# Patient Record
Sex: Male | Born: 1960 | Race: White | Hispanic: No | Marital: Married | Smoking: Never smoker
Health system: Southern US, Community
[De-identification: ages and names within clinical notes are randomized; demographics above are authoritative.]

## PROBLEM LIST (undated history)

## (undated) DIAGNOSIS — I10 Essential (primary) hypertension: Secondary | ICD-10-CM

## (undated) HISTORY — PX: APPENDECTOMY: SHX54

## (undated) HISTORY — DX: Essential (primary) hypertension: I10

---
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
## 2001-03-20 ENCOUNTER — Encounter: Admission: RE | Admit: 2001-03-20 | Discharge: 2001-03-20 | Payer: Self-pay | Admitting: Family Medicine

## 2001-03-20 ENCOUNTER — Encounter: Payer: Self-pay | Admitting: Family Medicine

## 2015-09-06 DIAGNOSIS — Z79899 Other long term (current) drug therapy: Secondary | ICD-10-CM | POA: Diagnosis not present

## 2015-09-06 DIAGNOSIS — L255 Unspecified contact dermatitis due to plants, except food: Secondary | ICD-10-CM | POA: Diagnosis not present

## 2015-09-06 DIAGNOSIS — I10 Essential (primary) hypertension: Secondary | ICD-10-CM | POA: Diagnosis not present

## 2015-09-06 DIAGNOSIS — L237 Allergic contact dermatitis due to plants, except food: Secondary | ICD-10-CM | POA: Diagnosis not present

## 2015-12-01 DIAGNOSIS — Z125 Encounter for screening for malignant neoplasm of prostate: Secondary | ICD-10-CM | POA: Diagnosis not present

## 2015-12-01 DIAGNOSIS — Z13220 Encounter for screening for lipoid disorders: Secondary | ICD-10-CM | POA: Diagnosis not present

## 2015-12-01 DIAGNOSIS — I10 Essential (primary) hypertension: Secondary | ICD-10-CM | POA: Diagnosis not present

## 2015-12-01 DIAGNOSIS — Z0000 Encounter for general adult medical examination without abnormal findings: Secondary | ICD-10-CM | POA: Diagnosis not present

## 2016-05-16 DIAGNOSIS — M25569 Pain in unspecified knee: Secondary | ICD-10-CM | POA: Diagnosis not present

## 2016-12-05 DIAGNOSIS — K219 Gastro-esophageal reflux disease without esophagitis: Secondary | ICD-10-CM | POA: Diagnosis not present

## 2016-12-05 DIAGNOSIS — Z125 Encounter for screening for malignant neoplasm of prostate: Secondary | ICD-10-CM | POA: Diagnosis not present

## 2016-12-05 DIAGNOSIS — L219 Seborrheic dermatitis, unspecified: Secondary | ICD-10-CM | POA: Diagnosis not present

## 2016-12-05 DIAGNOSIS — Z0000 Encounter for general adult medical examination without abnormal findings: Secondary | ICD-10-CM | POA: Diagnosis not present

## 2016-12-05 DIAGNOSIS — I10 Essential (primary) hypertension: Secondary | ICD-10-CM | POA: Diagnosis not present

## 2016-12-05 DIAGNOSIS — Z136 Encounter for screening for cardiovascular disorders: Secondary | ICD-10-CM | POA: Diagnosis not present

## 2016-12-24 DIAGNOSIS — K080 Exfoliation of teeth due to systemic causes: Secondary | ICD-10-CM | POA: Diagnosis not present

## 2017-02-08 DIAGNOSIS — M722 Plantar fascial fibromatosis: Secondary | ICD-10-CM | POA: Diagnosis not present

## 2017-02-08 DIAGNOSIS — M71571 Other bursitis, not elsewhere classified, right ankle and foot: Secondary | ICD-10-CM | POA: Diagnosis not present

## 2017-02-08 DIAGNOSIS — M7731 Calcaneal spur, right foot: Secondary | ICD-10-CM | POA: Diagnosis not present

## 2017-02-08 DIAGNOSIS — M79671 Pain in right foot: Secondary | ICD-10-CM | POA: Diagnosis not present

## 2017-06-11 DIAGNOSIS — F332 Major depressive disorder, recurrent severe without psychotic features: Secondary | ICD-10-CM | POA: Diagnosis not present

## 2017-07-04 DIAGNOSIS — Z23 Encounter for immunization: Secondary | ICD-10-CM | POA: Diagnosis not present

## 2017-07-11 DIAGNOSIS — R21 Rash and other nonspecific skin eruption: Secondary | ICD-10-CM | POA: Diagnosis not present

## 2017-07-11 DIAGNOSIS — F321 Major depressive disorder, single episode, moderate: Secondary | ICD-10-CM | POA: Diagnosis not present

## 2017-09-04 DIAGNOSIS — F321 Major depressive disorder, single episode, moderate: Secondary | ICD-10-CM | POA: Diagnosis not present

## 2017-09-04 DIAGNOSIS — I10 Essential (primary) hypertension: Secondary | ICD-10-CM | POA: Diagnosis not present

## 2017-10-16 DIAGNOSIS — K080 Exfoliation of teeth due to systemic causes: Secondary | ICD-10-CM | POA: Diagnosis not present

## 2017-11-18 DIAGNOSIS — K080 Exfoliation of teeth due to systemic causes: Secondary | ICD-10-CM | POA: Diagnosis not present

## 2018-05-30 DIAGNOSIS — F321 Major depressive disorder, single episode, moderate: Secondary | ICD-10-CM | POA: Diagnosis not present

## 2018-10-23 DIAGNOSIS — R1012 Left upper quadrant pain: Secondary | ICD-10-CM | POA: Diagnosis not present

## 2018-12-25 DIAGNOSIS — R05 Cough: Secondary | ICD-10-CM | POA: Diagnosis not present

## 2019-03-24 DIAGNOSIS — R0602 Shortness of breath: Secondary | ICD-10-CM | POA: Diagnosis not present

## 2019-03-24 DIAGNOSIS — Z8616 Personal history of COVID-19: Secondary | ICD-10-CM | POA: Diagnosis not present

## 2019-10-12 DIAGNOSIS — F329 Major depressive disorder, single episode, unspecified: Secondary | ICD-10-CM | POA: Diagnosis not present

## 2019-10-12 DIAGNOSIS — K219 Gastro-esophageal reflux disease without esophagitis: Secondary | ICD-10-CM | POA: Diagnosis not present

## 2019-10-12 DIAGNOSIS — R21 Rash and other nonspecific skin eruption: Secondary | ICD-10-CM | POA: Diagnosis not present

## 2019-11-05 DIAGNOSIS — Z1159 Encounter for screening for other viral diseases: Secondary | ICD-10-CM | POA: Diagnosis not present

## 2019-12-25 DIAGNOSIS — R21 Rash and other nonspecific skin eruption: Secondary | ICD-10-CM | POA: Diagnosis not present

## 2019-12-25 DIAGNOSIS — U099 Post covid-19 condition, unspecified: Secondary | ICD-10-CM | POA: Diagnosis not present

## 2019-12-25 DIAGNOSIS — R439 Unspecified disturbances of smell and taste: Secondary | ICD-10-CM | POA: Diagnosis not present

## 2019-12-25 DIAGNOSIS — G250 Essential tremor: Secondary | ICD-10-CM | POA: Diagnosis not present

## 2020-01-11 ENCOUNTER — Ambulatory Visit (INDEPENDENT_AMBULATORY_CARE_PROVIDER_SITE_OTHER): Payer: Federal, State, Local not specified - PPO | Admitting: Nurse Practitioner

## 2020-01-11 VITALS — BP 142/88 | HR 66 | Temp 97.7°F | Ht 71.0 in | Wt 182.0 lb

## 2020-01-11 DIAGNOSIS — R4189 Other symptoms and signs involving cognitive functions and awareness: Secondary | ICD-10-CM | POA: Insufficient documentation

## 2020-01-11 DIAGNOSIS — R0683 Snoring: Secondary | ICD-10-CM

## 2020-01-11 DIAGNOSIS — R9431 Abnormal electrocardiogram [ECG] [EKG]: Secondary | ICD-10-CM

## 2020-01-11 DIAGNOSIS — R0602 Shortness of breath: Secondary | ICD-10-CM

## 2020-01-11 DIAGNOSIS — U099 Post covid-19 condition, unspecified: Secondary | ICD-10-CM

## 2020-01-11 DIAGNOSIS — R21 Rash and other nonspecific skin eruption: Secondary | ICD-10-CM

## 2020-01-11 DIAGNOSIS — Z8616 Personal history of COVID-19: Secondary | ICD-10-CM | POA: Insufficient documentation

## 2020-01-11 DIAGNOSIS — R439 Unspecified disturbances of smell and taste: Secondary | ICD-10-CM | POA: Diagnosis not present

## 2020-01-11 DIAGNOSIS — R2990 Unspecified symptoms and signs involving the nervous system: Secondary | ICD-10-CM | POA: Diagnosis not present

## 2020-01-11 DIAGNOSIS — R438 Other disturbances of smell and taste: Secondary | ICD-10-CM

## 2020-01-11 HISTORY — DX: Rash and other nonspecific skin eruption: R21

## 2020-01-11 HISTORY — DX: Other disturbances of smell and taste: R43.8

## 2020-01-11 HISTORY — DX: Shortness of breath: R06.02

## 2020-01-11 HISTORY — DX: Snoring: R06.83

## 2020-01-11 HISTORY — DX: Unspecified disturbances of smell and taste: R43.9

## 2020-01-11 HISTORY — DX: Post covid-19 condition, unspecified: U09.9

## 2020-01-11 HISTORY — DX: Other symptoms and signs involving cognitive functions and awareness: R41.89

## 2020-01-11 MED ORDER — PREDNISONE 20 MG PO TABS: 20.0000 mg | ORAL_TABLET | Freq: Every day | ORAL | 0 refills | Status: AC

## 2020-01-11 NOTE — Progress Notes (Signed)
@Patient  ID: , male    DOB: 08/12/1969, 59 y.o.   MRN: [id number]  Chief Complaint  Patient presents with  . New Patient (Initial Visit)    COVID 11/2018 Sx: SOB, body rash, hand tremors, taste and smell.     Referring provider: No ref. provider found  59 year old male with no significant health history.  Diagnosed with Covid October 2020.  HPI  Patient presents today for post COVID care clinic visit.  He was diagnosed with Covid in October 2020.  Since that time he has had November 2020 Covid vaccine and a booster with [employer].  Patient states that since he was diagnosed with Covid he has had shortness of breath, intermittent rash to different areas of the body, hand tremors, and altered taste and smell.    Patient does have an upcoming appointment with dermatology and will discuss the rash in further detail with him at that time.  Patient notes that currently he does have a rash to his right wrist and inner thighs.  He has tried over-the-counter ointments and creams for this with minimal relief noted.   Patient has not seen a specialist for shortness of breath.  He states that he does get short of breath with exertion.  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  He tries to stay active.  He states that sometimes at night he wakes up gasping for breath.  Patient is wife does report that he snores at night.  Patient has been prescribed Flovent and albuterol by PCP.  Patient states that he had a chest x-ray in January 2021 that was clear.  Patient has noticed ongoing tremors to bilateral hands and arms.  He also states that he does have memory loss and trouble concentrating.  He states that he feels like he is confused at times.  Denies f/c/s, n/v/d, hemoptysis, PND, chest pain or edema.       No Known Allergies   There is no immunization history on file for this patient.  Past Medical History:  Diagnosis Date  . Hypertension     Tobacco History: Social  History   Tobacco Use  Smoking Status Never Smoker  Smokeless Tobacco Never Used   Counseling given: Not Answered   Outpatient Encounter Medications as of 01/11/2020  Medication Sig  . amLODipine (NORVASC) 10 MG tablet Take 10 mg by mouth daily.  . Ascorbic Acid (VITAMIN C) 500 MG CAPS Take by mouth.  01/13/2020 b complex vitamins capsule Take 1 capsule by mouth daily.  . Fluticasone Propionate HFA (FLOVENT HFA IN) Inhale into the lungs.  . Multiple Vitamin (MULTI-VITAMIN DAILY PO) Take by mouth.  . Zinc 50 MG TABS Take by mouth.  . predniSONE (DELTASONE) 20 MG tablet Take 1 tablet (20 mg total) by mouth daily with breakfast for 5 days.   No facility-administered encounter medications on file as of 01/11/2020.     Review of Systems  Review of Systems  Constitutional: Negative.   HENT: Negative.   Respiratory: Positive for shortness of breath. Negative for cough.   Cardiovascular: Negative.   Gastrointestinal: Negative.   Skin: Positive for rash.  Allergic/Immunologic: Negative.   Neurological: Positive for tremors.       Altered taste and smell  Psychiatric/Behavioral: Positive for confusion and decreased concentration.       Physical Exam  BP (!) 142/88 (BP Location: Right Arm)   Pulse 66   Temp 97.7 F (36.5 C)   Ht 5\' 11"  (  1.803 m)   Wt 182 lb 0.1 oz (82.6 kg)   SpO2 97%   BMI 25.38 kg/m   Wt Readings from Last 5 Encounters:  01/11/20 182 lb 0.1 oz (82.6 kg)     Physical Exam Vitals and nursing note reviewed.  Constitutional:      General: He is not in acute distress.    Appearance: He is well-developed.  Cardiovascular:     Rate and Rhythm: Normal rate.     Comments: Slightly irregular Pulmonary:     Effort: Pulmonary effort is normal.     Breath sounds: Normal breath sounds.  Skin:    General: Skin is warm and dry.     Findings: Rash (right wrist) present.  Neurological:     Mental Status: He is alert and oriented to person, place, and time.   Psychiatric:        Mood and Affect: Mood normal.        Behavior: Behavior normal.        Assessment & Plan:   History of COVID-19 Altered taste and smell Hand tremors Cognitive impairment Snoring:  Will place referral to neurology - Dr. Vickey Huger - altered taste and smell, tremors, may need sleep study  Will place referral to Speech - cognitive rehabilitation  Shortness of breath:  EKG: showed NSR, left atrial enlargement, ? Inferior infarct - age undetermined - will refer to cardiology  Patient walked in office O2 sats stable - heart rate stable   Rash:  Please keep upcoming appointment with dermatology  Will order prednisone  Follow up:  Follow up after seeing cardiology and neurology       Ivonne Andrew, NP 01/11/2020

## 2020-01-11 NOTE — Assessment & Plan Note (Signed)
Altered taste and smell Hand tremors Cognitive impairment Snoring:  Will place referral to neurology - Dr. Vickey Huger - altered taste and smell, tremors, may need sleep study  Will place referral to Speech - cognitive rehabilitation  Shortness of breath:  EKG: showed NSR, left atrial enlargement, ? Inferior infarct - age undetermined - will refer to cardiology  Patient walked in office O2 sats stable - heart rate stable   Rash:  Please keep upcoming appointment with dermatology  Will order prednisone  Follow up:  Follow up after seeing cardiology and neurology

## 2020-01-11 NOTE — Patient Instructions (Addendum)
History of Covid Altered taste and smell Hand tremors Cognitive impairment Snoring:  Will place referral to neurology - Dr. Vickey Huger - altered taste and smell, tremors, may need sleep study  Will place referral to Speech - cognitive rehabilitation  Shortness of breath:  EKG: showed NSR, left atrial enlargement, ? Inferior infarct - age undetermined - will refer to cardiology  Patient walked in office O2 sats stable - heart rate stable   Rash:  Please keep upcoming appointment with dermatology  Will order prednisone  Follow up:  Follow up after seeing cardiology and neurology

## 2020-01-19 DIAGNOSIS — D225 Melanocytic nevi of trunk: Secondary | ICD-10-CM | POA: Diagnosis not present

## 2020-01-19 DIAGNOSIS — L814 Other melanin hyperpigmentation: Secondary | ICD-10-CM | POA: Diagnosis not present

## 2020-01-19 DIAGNOSIS — D1801 Hemangioma of skin and subcutaneous tissue: Secondary | ICD-10-CM | POA: Diagnosis not present

## 2020-01-19 DIAGNOSIS — L821 Other seborrheic keratosis: Secondary | ICD-10-CM | POA: Diagnosis not present

## 2020-01-26 ENCOUNTER — Ambulatory Visit (INDEPENDENT_AMBULATORY_CARE_PROVIDER_SITE_OTHER): Payer: Federal, State, Local not specified - PPO | Admitting: Internal Medicine

## 2020-01-26 ENCOUNTER — Other Ambulatory Visit: Payer: Self-pay

## 2020-01-26 ENCOUNTER — Encounter: Payer: Self-pay | Admitting: Internal Medicine

## 2020-01-26 VITALS — BP 134/76 | HR 64 | Ht 71.0 in | Wt 182.8 lb

## 2020-01-26 DIAGNOSIS — R0600 Dyspnea, unspecified: Secondary | ICD-10-CM

## 2020-01-26 DIAGNOSIS — Z8616 Personal history of COVID-19: Secondary | ICD-10-CM

## 2020-01-26 DIAGNOSIS — R079 Chest pain, unspecified: Secondary | ICD-10-CM

## 2020-01-26 DIAGNOSIS — I10 Essential (primary) hypertension: Secondary | ICD-10-CM

## 2020-01-26 DIAGNOSIS — R072 Precordial pain: Secondary | ICD-10-CM | POA: Diagnosis not present

## 2020-01-26 DIAGNOSIS — R0602 Shortness of breath: Secondary | ICD-10-CM

## 2020-01-26 DIAGNOSIS — R0609 Other forms of dyspnea: Secondary | ICD-10-CM

## 2020-01-26 HISTORY — DX: Chest pain, unspecified: R07.9

## 2020-01-26 HISTORY — DX: Other forms of dyspnea: R06.09

## 2020-01-26 HISTORY — DX: Dyspnea, unspecified: R06.00

## 2020-01-26 MED ORDER — METOPROLOL TARTRATE 50 MG PO TABS: ORAL_TABLET | ORAL | 0 refills | Status: DC

## 2020-01-26 NOTE — Progress Notes (Signed)
Cardiology Office Note:    Date:  01/26/2020   ID:  Mario Bryan, DOB 06/26/1960, MRN [id number]  PCP:  [place] [place] Cardiologist:  Werner Lean, MD  [facility] Electrophysiologist:  None   CC: random bouts of shortness of breath Consulted for the evaluation of inferior infarct pattern on ECG at the behest of [place], [place]  History of Present Illness:    Mario Bryan is a 59 y.o. male with a hx of HTN, Prior COVID-19 (seen in post-COVID clinic) with residual shortness of breath.  Patient notes that he feels well.  Notes that three months ago and with bending over three moths ago felt short of breath and pins and needles chest pain.  Resolved with standing back up.  Notes that he has shortness of breath at night when laying on his stomach.  Improves when lying flat.  Notes a 4 lb weight gain.  No lower extremity edema.  Still has bouts of resting and exertional SOB; no clear trigger.  Better with rest.  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  With exertion and SOB has chest pressure and tightness without radiation.  Also improved with rest. With big meals does not feel syncopal, weak, or shortness of breath.    Ambulatory BP is not performed.  Past Medical History:  Diagnosis Date  . Hypertension     Past Surgical History:  Procedure Laterality Date  . APPENDECTOMY      Current Medications: Current Meds  Medication Sig  . amLODipine-valsartan (EXFORGE) 10-160 MG tablet Take 1 tablet by mouth daily.  . Ascorbic Acid (VITAMIN C) 500 MG CAPS Take by mouth.  Marland Kitchen b complex vitamins capsule Take 1 capsule by mouth daily.  Marland Kitchen buPROPion (WELLBUTRIN XL) 150 MG 24 hr tablet Take 150 mg by mouth at bedtime.  . clobetasol cream (TEMOVATE) 0.05 % Apply topically 2 (two) times daily as needed.  . Fluticasone Propionate HFA (FLOVENT HFA IN) Inhale into the lungs.  . lansoprazole (PREVACID SOLUTAB)  30 MG disintegrating tablet Take 30 mg by mouth daily at 12 noon.  . Multiple Vitamin (MULTI-VITAMIN DAILY PO) Take by mouth.  . pimecrolimus (ELIDEL) 1 % cream Apply topically.  . predniSONE (DELTASONE) 20 MG tablet Take 20 mg by mouth 2 (two) times daily.  . Zinc 50 MG TABS Take by mouth.  . [DISCONTINUED] amLODipine (NORVASC) 10 MG tablet Take 10 mg by mouth daily.     Allergies:   Patient has no known allergies.   Social History   Socioeconomic History  . Marital status: Married    Spouse name: Not on file  . Number of children: Not on file  . Years of education: Not on file  . Highest education level: Not on file  Occupational History  . Not on file  Tobacco Use  . Smoking status: Never Smoker  . Smokeless tobacco: Never Used  Substance and Sexual Activity  . Alcohol use: Yes    Comment: occ  . Drug use: Never  . Sexual activity: Not on file  Other Topics Concern  . Not on file  Social History Narrative  . Not on file   Social Determinants of Health   Financial Resource Strain:   . Difficulty of Paying Living Expenses: Not on file  Food Insecurity:   . Worried About Charity fundraiser in the Last Year: Not on file  .  Ran Out of Food in the Last Year: Not on file  Transportation Needs:   . Lack of Transportation (Medical): Not on file  . Lack of Transportation (Non-Medical): Not on file  Physical Activity:   . Days of Exercise per Week: Not on file  . Minutes of Exercise per Session: Not on file  Stress:   . Feeling of Stress : Not on file  Social Connections:   . Frequency of Communication with Friends and Family: Not on file  . Frequency of Social Gatherings with Friends and Family: Not on file  . Attends Religious Services: Not on file  . Active Member of Clubs or Organizations: Not on file  . Attends Archivist Meetings: Not on file  . Marital Status: Not on file     Family History: The patient's family history includes Cancer in his  mother; Hypertension in his father. Grandfather may have had MI vs alcohol, unclear. No history of HCM No history of SCD/drownings/car accidents.  ROS:   Please see the history of present illness.    All other systems reviewed and are negative.  EKGs/Labs/Other Studies Reviewed:    The following studies were reviewed today:  EKG:  EKG is  ordered today.  The ekg ordered today demonstrates sinus bradycardia with borderline LAE  Recent Labs: No results found for requested labs within last 8760 hours.  Recent Lipid Panel No results found for: CHOL, TRIG, HDL, CHOLHDL, VLDL, LDLCALC, LDLDIRECT  [facility] Labs 12/05/2016 Cholesterol 144 HDL 45 LDL 77 TG 108   12/25/19 Creatinine 0.8 Hgb 15.7  Risk Assessment/Calculations:     ASCVD risk estimated at 8.8%  Physical Exam:    VS:  BP 134/76   Pulse 64   Ht 5' 11"  (1.803 m)   Wt 182 lb 12.8 oz (82.9 kg)   SpO2 95%   BMI 25.50 kg/m     Wt Readings from Last 3 Encounters:  01/26/20 182 lb 12.8 oz (82.9 kg)  01/11/20 182 lb 0.1 oz (82.6 kg)     GEN: Well nourished, well developed in no acute distress HEENT: Normal NECK: No JVD; No carotid bruits LYMPHATICS: No lymphadenopathy CARDIAC: RRR, no murmurs, rubs, gallops RESPIRATORY:  Clear to auscultation without rales, wheezing or rhonchi  ABDOMEN: Soft, non-tender, non-distended MUSCULOSKELETAL:  No edema; No deformity  SKIN: Warm and dry NEUROLOGIC:  Alert and oriented x 3 PSYCHIATRIC:  Normal affect   ASSESSMENT:    1. Chest pain of uncertain etiology   2. DOE (dyspnea on exertion)   3. Precordial pain    PLAN:    In order of problems listed above:   COVID-19 HTN SOB - Concern that DOE and chest pain with are anginal in nature - prior issues with inferior infarct pattern ECG in the past - ASCVD risk estimated at 8.8% - Would recommend an echocardiogram to assess LVEF and exclude WMA.  - Would recommend CCTA to exclude obstructive CAD  - if positive,  discussed risks and benefits of cardiac catheterization have been discussed with the patient.  These include bleeding, infection, kidney damage, stroke, heart attack, death.  The patient understands these risks and is willing to proceed if necessary - Patient to restart ambulatory BP monitoring  3 months follow up unless new symptoms or abnormal test results warranting change in plan  Would be reasonable for  Virtual Follow up Would be reasonable for  APP Follow up    Shared Decision Making/Informed Consent  Patient and wife amenable to plan  Medication Adjustments/Labs and Tests Ordered: Current medicines are reviewed at length with the patient today.  Concerns regarding medicines are outlined above.  Orders Placed This Encounter  Procedures  . CT CORONARY MORPH W/CTA COR W/SCORE W/CA W/CM &/OR WO/CM  . CT CORONARY FRACTIONAL FLOW RESERVE DATA PREP  . CT CORONARY FRACTIONAL FLOW RESERVE FLUID ANALYSIS  . Basic metabolic panel  . ECHOCARDIOGRAM COMPLETE   Meds ordered this encounter  Medications  . metoprolol tartrate (LOPRESSOR) 50 MG tablet    Sig: Take 1 tablet by mouth 2 hours prior to Cardiac CT    Dispense:  1 tablet    Refill:  0    Patient Instructions  Medication Instructions:  Your physician recommends that you continue on your current medications as directed. Please refer to the Current Medication list given to you today.  *If you need a refill on your cardiac medications before your next appointment, please call your pharmacy*   Lab Work: None today  Your physician recommends that you return for lab work prior to your Cardiac CT for a BMET  If you have labs (blood work) drawn today and your tests are completely normal, you will receive your results only by: Marland Kitchen MyChart Message (if you have MyChart) OR . A paper copy in the mail If you have any lab test that is abnormal or we need to change your treatment, we will call you to review the  results.   Testing/Procedures: Your physician has requested that you have an echocardiogram. Echocardiography is a painless test that uses sound waves to create images of your heart. It provides your doctor with information about the size and shape of your heart and how well your heart's chambers and valves are working. This procedure takes approximately one hour. There are no restrictions for this procedure.  Your physician has requested that you have cardiac CT.    Follow-Up: At [facility], you and your health needs are our priority.  As part of our continuing mission to provide you with exceptional heart care, we have created designated Provider Care Teams.  These Care Teams include your primary Cardiologist (physician) and Advanced Practice Providers (APPs -  Physician Assistants and Nurse Practitioners) who all work together to provide you with the care you need, when you need it.  We recommend signing up for the patient portal called "MyChart".  Sign up information is provided on this After Visit Summary.  MyChart is used to connect with patients for Virtual Visits (Telemedicine).  Patients are able to view lab/test results, encounter notes, upcoming appointments, etc.  Non-urgent messages can be sent to your provider as well.   To learn more about what you can do with MyChart, go to [email or web address].    Your next appointment:   3 month(s)  The format for your next appointment:   In Person  Provider:   Rudean Haskell, MD   Other Instructions Your cardiac CT will be scheduled at one of the below locations:   [facility] [address] [place], [place] [address] [phone]  [place] [address] [place], [place] [address] [phone]  If scheduled at [facility], please arrive at the [facility] main entrance of [facility] 30 minutes prior to test start time. Proceed  to the [facility] Radiology Department (first floor) to check-in and test prep.  If scheduled at [facility],  please arrive 15 mins early for check-in and test prep.  Please follow these instructions carefully (unless otherwise directed):  Hold all erectile dysfunction medications at least 3 days (72 hrs) prior to test.  On the Night Before the Test: . Be sure to Drink plenty of water. . Do not consume any caffeinated/decaffeinated beverages or chocolate 12 hours prior to your test. . Do not take any antihistamines 12 hours prior to your test.  On the Day of the Test: . Drink plenty of water. Do not drink any water within one hour of the test. . Do not eat any food 4 hours prior to the test. . You may take your regular medications prior to the test.  . Take metoprolol (Lopressor) 50 MG two hours prior to test.       After the Test: . Drink plenty of water. . After receiving IV contrast, you may experience a mild flushed feeling. This is normal. . On occasion, you may experience a mild rash up to 24 hours after the test. This is not dangerous. If this occurs, you can take Benadryl 25 mg and increase your fluid intake. . If you experience trouble breathing, this can be serious. If it is severe call 911 IMMEDIATELY. If it is mild, please call our office.   Once we have confirmed authorization from your insurance company, we will call you to set up a date and time for your test. Based on how quickly your insurance processes prior authorizations requests, please allow up to 4 weeks to be contacted for scheduling your Cardiac CT appointment. Be advised that routine Cardiac CT appointments could be scheduled as many as 8 weeks after your provider has ordered it.  For non-scheduling related questions, please contact the cardiac imaging nurse navigator should you have any questions/concerns: Marchia Bond, Cardiac Imaging Nurse Navigator Burley Saver, Interim Cardiac  Imaging Nurse [place] and Vascular Services Direct Office Dial: [phone]   For scheduling needs, including cancellations and rescheduling, please call [place], [phone].       Signed, Werner Lean, MD  01/26/2020 11:39 AM    [place]

## 2020-01-26 NOTE — Patient Instructions (Addendum)
Medication Instructions:  Your physician recommends that you continue on your current medications as directed. Please refer to the Current Medication list given to you today.  *If you need a refill on your cardiac medications before your next appointment, please call your pharmacy*   Lab Work: None today  Your physician recommends that you return for lab work prior to your Cardiac CT for a BMET  If you have labs (blood work) drawn today and your tests are completely normal, you will receive your results only by: Marland Kitchen MyChart Message (if you have MyChart) OR . A paper copy in the mail If you have any lab test that is abnormal or we need to change your treatment, we will call you to review the results.   Testing/Procedures: Your physician has requested that you have an echocardiogram. Echocardiography is a painless test that uses sound waves to create images of your heart. It provides your doctor with information about the size and shape of your heart and how well your heart's chambers and valves are working. This procedure takes approximately one hour. There are no restrictions for this procedure.  Your physician has requested that you have cardiac CT.    Follow-Up: At [facility], you and your health needs are our priority.  As part of our continuing mission to provide you with exceptional heart care, we have created designated Provider Care Teams.  These Care Teams include your primary Cardiologist (physician) and Advanced Practice Providers (APPs -  Physician Assistants and Nurse Practitioners) who all work together to provide you with the care you need, when you need it.  We recommend signing up for the patient portal called "MyChart".  Sign up information is provided on this After Visit Summary.  MyChart is used to connect with patients for Virtual Visits (Telemedicine).  Patients are able to view lab/test results, encounter notes, upcoming appointments, etc.  Non-urgent messages can be  sent to your provider as well.   To learn more about what you can do with MyChart, go to [email or web address].    Your next appointment:   3 month(s)  The format for your next appointment:   In Person  Provider:   Rudean Haskell, MD   Other Instructions Your cardiac CT will be scheduled at one of the below locations:   [facility] [address] [place], [place] [address] [phone]  [place] [address] [place], [place] [address] [phone]  If scheduled at [facility], please arrive at the [facility] main entrance of [facility] 30 minutes prior to test start time. Proceed to the [facility] Radiology Department (first floor) to check-in and test prep.  If scheduled at [facility], please arrive 15 mins early for check-in and test prep.  Please follow these instructions carefully (unless otherwise directed):  Hold all erectile dysfunction medications at least 3 days (72 hrs) prior to test.  On the Night Before the Test: . Be sure to Drink plenty of water. . Do not consume any caffeinated/decaffeinated beverages or chocolate 12 hours prior to your test. . Do not take any antihistamines 12 hours prior to your test.  On the Day of the Test: . Drink plenty of water. Do not drink any water within one hour of the test. . Do not eat any food 4 hours prior to the test. . You may take your regular medications prior to the test.  . Take metoprolol (  Lopressor) 50 MG two hours prior to test.       After the Test: . Drink plenty of water. . After receiving IV contrast, you may experience a mild flushed feeling. This is normal. . On occasion, you may experience a mild rash up to 24 hours after the test. This is not dangerous. If this occurs, you can take Benadryl 25 mg and increase your fluid intake. . If you experience trouble breathing, this can be  serious. If it is severe call 911 IMMEDIATELY. If it is mild, please call our office.   Once we have confirmed authorization from your insurance company, we will call you to set up a date and time for your test. Based on how quickly your insurance processes prior authorizations requests, please allow up to 4 weeks to be contacted for scheduling your Cardiac CT appointment. Be advised that routine Cardiac CT appointments could be scheduled as many as 8 weeks after your provider has ordered it.  For non-scheduling related questions, please contact the cardiac imaging nurse navigator should you have any questions/concerns: Marchia Bond, Cardiac Imaging Nurse Navigator Burley Saver, Interim Cardiac Imaging Nurse [place] and Vascular Services Direct Office Dial: [phone]   For scheduling needs, including cancellations and rescheduling, please call [place], [phone].

## 2020-01-28 NOTE — Addendum Note (Signed)
Addended by: Adele Schilder on: 01/28/2020 08:32 AM   Modules accepted: Orders

## 2020-02-17 ENCOUNTER — Other Ambulatory Visit (HOSPITAL_COMMUNITY): Payer: Federal, State, Local not specified - PPO

## 2020-02-22 ENCOUNTER — Telehealth (HOSPITAL_COMMUNITY): Payer: Self-pay | Admitting: Emergency Medicine

## 2020-02-22 NOTE — Telephone Encounter (Signed)
Reaching out to patient to offer assistance regarding upcoming cardiac imaging study; pt verbalizes understanding of appt date/time, parking situation and where to check in, pre-test NPO status and medications ordered, and verified current allergies; name and call back number provided for further questions should they arise [name] RN Navigator Cardiac Imaging [name] [phone] office [phone] cell   Pt reminded to have labs drawn, sts will do that tomorrow 12/28 Mario Bryan

## 2020-02-23 ENCOUNTER — Other Ambulatory Visit: Payer: Federal, State, Local not specified - PPO | Admitting: *Deleted

## 2020-02-23 ENCOUNTER — Other Ambulatory Visit: Payer: Self-pay

## 2020-02-23 ENCOUNTER — Ambulatory Visit (HOSPITAL_BASED_OUTPATIENT_CLINIC_OR_DEPARTMENT_OTHER): Payer: Federal, State, Local not specified - PPO

## 2020-02-23 DIAGNOSIS — R072 Precordial pain: Secondary | ICD-10-CM

## 2020-02-23 DIAGNOSIS — R079 Chest pain, unspecified: Secondary | ICD-10-CM | POA: Diagnosis not present

## 2020-02-23 DIAGNOSIS — R0609 Other forms of dyspnea: Secondary | ICD-10-CM

## 2020-02-23 DIAGNOSIS — R0600 Dyspnea, unspecified: Secondary | ICD-10-CM

## 2020-02-23 LAB — ECHOCARDIOGRAM COMPLETE
Area-P 1/2: 2.65 cm2
S' Lateral: 2.8 cm

## 2020-02-24 ENCOUNTER — Ambulatory Visit (HOSPITAL_COMMUNITY)
Admission: RE | Admit: 2020-02-24 | Discharge: 2020-02-24 | Disposition: A | Discharge status: Home / Self Care | Payer: Federal, State, Local not specified - PPO | Source: Ambulatory Visit | Attending: Internal Medicine | Admitting: Internal Medicine

## 2020-02-24 DIAGNOSIS — R072 Precordial pain: Secondary | ICD-10-CM | POA: Insufficient documentation

## 2020-02-24 DIAGNOSIS — R0600 Dyspnea, unspecified: Secondary | ICD-10-CM | POA: Insufficient documentation

## 2020-02-24 DIAGNOSIS — R079 Chest pain, unspecified: Secondary | ICD-10-CM | POA: Diagnosis not present

## 2020-02-24 DIAGNOSIS — Z006 Encounter for examination for normal comparison and control in clinical research program: Secondary | ICD-10-CM

## 2020-02-24 LAB — BASIC METABOLIC PANEL
BUN/Creatinine Ratio: 18 (ref 10–24)
BUN: 15 mg/dL (ref 8–27)
CO2: 24 mmol/L (ref 20–29)
Calcium: 9.9 mg/dL (ref 8.6–10.2)
Chloride: 100 mmol/L (ref 96–106)
Creatinine, Ser: 0.83 mg/dL (ref 0.76–1.27)
GFR calc Af Amer: 111 mL/min/{1.73_m2} (ref >59)
GFR calc non Af Amer: 96 mL/min/{1.73_m2} (ref >59)
Glucose: 87 mg/dL (ref 65–99)
Potassium: 4.3 mmol/L (ref 3.5–5.2)
Sodium: 139 mmol/L (ref 134–144)

## 2020-02-24 LAB — SPECIMEN STATUS REPORT

## 2020-02-24 MED ORDER — NITROGLYCERIN 0.4 MG SL SUBL
SUBLINGUAL_TABLET | SUBLINGUAL | Status: AC
  Filled 2020-02-24: qty 2

## 2020-02-24 MED ORDER — IOHEXOL 350 MG/ML SOLN
80.0000 mL | Freq: Once | INTRAVENOUS | Status: AC | PRN
  Administered 2020-02-24: 80 mL via INTRAVENOUS

## 2020-02-24 MED ORDER — NITROGLYCERIN 0.4 MG SL SUBL
0.8000 mg | SUBLINGUAL_TABLET | Freq: Once | SUBLINGUAL | Status: AC
  Administered 2020-02-24: 0.8 mg via SUBLINGUAL

## 2020-02-24 NOTE — Progress Notes (Signed)
CT scan completed. Tolerated well. D/C home ambulatory with wife, awake and alert. In no distress 

## 2020-02-24 NOTE — Research (Signed)
IDENTIFY Informed Consent   Subject Name: Mario Bryan  Subject met inclusion and exclusion criteria.  The informed consent form, study requirements and expectations were reviewed with the subject and questions and concerns were addressed prior to the signing of the consent form.  The subject verbalized understanding of the trial requirements.  The subject agreed to participate in the IDENTIFY trial and signed the informed consent at 1242 on 29/DEC/2021.  The informed consent was obtained prior to performance of any protocol-specific procedures for the subject.  A copy of the signed informed consent was given to the subject and a copy was placed in the subject's medical record.   Sherlyn Lees

## 2020-03-02 ENCOUNTER — Ambulatory Visit: Payer: Federal, State, Local not specified - PPO

## 2020-03-16 ENCOUNTER — Ambulatory Visit: Payer: Federal, State, Local not specified - PPO | Admitting: Neurology

## 2020-03-21 DIAGNOSIS — L249 Irritant contact dermatitis, unspecified cause: Secondary | ICD-10-CM | POA: Diagnosis not present

## 2020-03-22 ENCOUNTER — Other Ambulatory Visit: Payer: Self-pay

## 2020-03-22 ENCOUNTER — Ambulatory Visit: Payer: Federal, State, Local not specified - PPO | Attending: Nurse Practitioner

## 2020-03-22 DIAGNOSIS — R41841 Cognitive communication deficit: Secondary | ICD-10-CM | POA: Diagnosis not present

## 2020-03-23 NOTE — Patient Instructions (Signed)
Use your alarm on your phone in order to assist you taking your medicine on weekends.

## 2020-03-23 NOTE — Therapy (Signed)
[facility] [address] [place], [place], [address] Phone: [phone]   Fax:  [phone]  Speech Language Pathology Evaluation  Patient Details  Name: Mario Bryan MRN: [id number] Date of Birth: 12/22/1960 Referring Provider (SLP): Angus Seller, NP   Encounter Date: 03/22/2020   End of Session - 03/23/20 1046    Visit Number 1    Number of Visits 17    Date for SLP Re-Evaluation 06/20/20    SLP Start Time 1106    SLP Stop Time  1147    SLP Time Calculation (min) 41 min    Activity Tolerance Patient tolerated treatment well           Past Medical History:  Diagnosis Date  . Hypertension     Past Surgical History:  Procedure Laterality Date  . APPENDECTOMY      There were no vitals filed for this visit.   Subjective Assessment - 03/22/20 1125    Subjective Pt reports s/sx alternating attention deficits.    Patient is accompained by: Family member   wife   Currently in Pain? No/denies              SLP Evaluation [facility] - 03/23/20 0001      SLP Visit Information   SLP Received On 03/22/20    Referring Provider (SLP) Angus Seller, NP    Onset Date October 2020    Medical Diagnosis History Covid-19, cognitive impairment      Subjective   Patient/Family Stated Goal Pt would like to return to [facility]      General Information   HPI Since COVID-19 (non-hospitalized) in OCtober 2020 Will feels like he has "to stop and just think about things" more often than prior to COVID dx. He endorses s/sx of alternating attention deficit and memory deficits (e.g., he requires wife to remind him to take his meds on the weekends). Pt states there have been no instances at work where he has been written up for less than satisfactory work performance since [employer] dx.      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 Pt had much difficulty with serial subtraction of 7s, and had one self correction on screening tool with auditory letter ID in a long string of letters.    Memory Comments pt recalled 0/5 random words on screening tool. Pt requires a paired activity to remember to take his meds each day - on weekends his wife needs to remind him to take his meds.    Problem Solving --   possible deficit- as pt did not think to use alarm on his phone for assistance with taking meds on weekends     Auditory Comprehension   Overall Auditory Comprehension Appears within functional limits for tasks assessed  Verbal Expression   Overall Verbal Expression Appears within functional limits for tasks assessed      Oral Motor/Sensory Function   Overall Oral Motor/Sensory Function Appears within functional limits for tasks assessed      Motor Speech   Overall Motor Speech Appears within functional limits for tasks assessed      Standardized Assessments   Standardized Assessments  Montreal Cognitive Assessment (MOCA)    Montreal Cognitive Assessment [facility])  pt demonstrated deficit areas listed above with this assessment tool                           SLP Education - 03/23/20 0929    Education Details compensations for memory, deficit areas and possible goals    Person(s) Educated Patient;Spouse    Methods Explanation;Verbal cues     Comprehension Verbalized understanding;Need further instruction            SLP Short Term Goals - 03/23/20 1058      SLP SHORT TERM GOAL #1   Title pt will use memory compensations for appointments, meds, and other daily activities with min A between or in 3 sessions    Time 4    Period Weeks    Status New      SLP SHORT TERM GOAL #2   Title pt will demonstrate selective attention for 15 minutes of cognitive linguistic tasks in a mod noisy environment    Time 4    Period Weeks    Status New      SLP SHORT TERM GOAL #3   Title pt will complete formal cognitive linguistic assessment    Time 2    Period Weeks    Status New            SLP Long Term Goals - 03/23/20 1137      SLP LONG TERM GOAL #1   Title pt will independently use memory compensations for appointments, meds, work responsibiliites, to-do lists, and other daily activities between or in 2 sessions    Time 12    Period Weeks   or 13 total sessions   Status New      SLP LONG TERM GOAL #2   Title pt will demo 15 minutes alternating attention between 2 mod complex cognitive linguisitic tasks with compensations/modified independence in 2 sessions    Time 12    Period Weeks    Status New            Plan - 03/23/20 1047    Clinical Impression Statement Pt presents today with reported signs of attention and memory deficits and evidence of attention and memory deficits from screening tool (MOCA).    Speech Therapy Frequency 2x / week   however pt prefers x1/week due to his work schedule   Duration 8 weeks   or 9 total sessions   Treatment/Interventions Functional tasks;SLP instruction and feedback;Cueing hierarchy;Internal/external aids;Patient/family education;Compensatory strategies;Cognitive reorganization;Environmental controls    Potential to Achieve Goals Fair    Potential Considerations --   once/week frequency instead of recommended x2/week   Consulted and Agree with Plan of Care Patient            Patient will benefit from skilled therapeutic intervention in order to improve the following deficits and impairments:   Cognitive communication deficit    Problem List Patient Active Problem List   Diagnosis Date Noted  . Chest pain of uncertain etiology 01/26/2020  . DOE (dyspnea on exertion)  01/26/2020  . Essential hypertension 01/26/2020  . History of COVID-19 01/11/2020  . Cognitive impairment 01/11/2020  . 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 Snoring 01/11/2020  . Shortness of breath 01/11/2020  . Rash 01/11/2020    Ona Roehrs ,MS, CCC-SLP  03/23/2020, 11:43 AM  [facility] [address] [place], [place], [address] Phone: [phone]   Fax:  [phone]  Name: Mario Bryan MRN: [id number] Date of Birth: 12-21-1960

## 2020-03-31 ENCOUNTER — Encounter: Payer: Self-pay | Admitting: Neurology

## 2020-03-31 ENCOUNTER — Ambulatory Visit: Payer: Federal, State, Local not specified - PPO | Admitting: Neurology

## 2020-03-31 VITALS — BP 139/91 | HR 81 | Ht 71.0 in | Wt 180.0 lb

## 2020-03-31 DIAGNOSIS — U099 Post covid-19 condition, unspecified: Secondary | ICD-10-CM | POA: Insufficient documentation

## 2020-03-31 MED ORDER — BUPROPION HCL ER (XL) 150 MG PO TB24
300.0000 mg | ORAL_TABLET | Freq: Every morning | ORAL | 3 refills | Status: DC
Start: 2020-03-31 — End: 2020-10-04

## 2020-03-31 NOTE — Patient Instructions (Signed)
Loss of smell, also known as ANOSMIA, is one of the most common symptoms of COVID-19.   The [employer] Professor of Otolaryngology Jay Piccirillo, MD, and Assistant Professor Dorina Kallogjeri, MD, have investigated several treatments for persistent anosmia (loss of smell), with a special interest in viral-related anosmia.     As the number of total, confirmed COVID-19 cases increases so does the number of people suffering from disease-related anosmia, making anosmia a significant public health problem.     Rehabilitation of anosmia :   Smell daily one sample of the following categories; and look at a picture of the object - f. Example at a picture of a lemon while smelling a lemon oil.   A floral scent  - such as rose, lavender, or vanilla - all would qualify for these.  A spice scent - nutmeg, anise, coffee, cinnamon-  A citrus smell - lemon, lime, orange  A menthol or minty scent, or eucalyptus.   Goal is to re-associate scent ( and eventually taste ) to the image.  The success is gradual, and this form of rehabilitation may take 12 month to succeed.   Another complication is PAROSMIA - smelling something that is not present, often an unpleasant smell of burning rubber or spoiled , foul smells.   This can be reduced by using a carbamazepine at 100 mg po bid. This antiepileptic medication slows the nerve conduction and allows the reduction in abnormal smell sensation.    Souleymane Saiki, MD       Treatment of ANOSMIA and Ageusia: If the combination of smell and vision of the same object that you're smelling improves the recovery of smell, then just smell alone. So we call that bimodal, visual and olfactory stimulation. So the standard olfactory training is four scents: rose, lemon, cloves and eucalyptus.  And the subject sniffs twice a day, 10, 12, 20 seconds each time the four essential oils for anywhere to six, eight or 12 weeks.  And olfactory training for anosmia  has been around for maybe 10, 15 years, studies suggest it works.  There's nothing absolutely definitive- but it can't hurt. It's thought to work by retraining the brain, the process of neuroplasticity, retraining the brain to smell these odors again. And we think that by smelling, you can retrain the brain. Now, one of the outstanding questions is, are you retraining the brain to smell rose, lemon, cloves and eucalyptus better, but forget about coffee, vanilla, coconut?  How generalizable is the improvement in smell? Jim, we asked the patients what smells would you like to pick and train on? The answer was fascinating to us. I thought it might be coffee as a coffee lover. I thought it might be vanilla as a vanilla-bean ice cream lover. It was smoke. The number one odor, smell, if you will, that people wanted to train on and to get better was smoke for the reasons that we talked about before.  The sense of smell is first and foremost attached to safety. And these people felt that if they could smell smoke better, they would feel more safe.  Unfortunately, there isn't a smoke essential oil so we couldn't do that.  But it just reinforced to us how important it is for patients to pick what smells they want to train on.  The other unique aspect that should be brought up is that half of the group are looking at high-quality pictures of the item, one of four items they picked, and each   each one of them have high-quality photographs presented on their iPhone or iPad at the same time that they smell.

## 2020-03-31 NOTE — Progress Notes (Signed)
SLEEP MEDICINE CLINIC    Provider:  Melvyn Novas, MD  Primary Care Physician:  [facility] [address] [place] [address]     Referring Provider: [facility]   Chief Complaint according to patient   Patient presents with:    . 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   has a past medical history of Hypertension , in October 2020 he contracted COVIID and was not hospitalized, had congestion, coughing, myalgia, SOB,since then ageusia, anosmia and brain fog.    Most recent lab results were from 12/28/2019 and show an elevated glucose which may not have been a fasting glucose level as the test was obtained at 4:44 PM, BUN was normal creatinine was normal calcium was fine total protein was 7 g/dL albumin was normal ALP AST and ALT are fine electrolytes are normal vision.  The patient stated that he has an essential tremor that he has noted since he had COVID, he was not specifically treated with any specific medication and he has since gotten his vaccinations for Covid.        Anosmia testing ;  Vanilla - identified by scent. Coconut - identified by scent. Clove powder- identified by scent. Pepper mint - identified.  Grapefruit - " lemon"  Rum - not identified by scent, only alcohol.  Lavender- 'bath salt"        Review of Systems: Out of a complete 14 system review, the patient complains of only the following symptoms, and all other reviewed systems are negative.:     Tremor ; archimedic spiral shows mild tremor.  Very low amplitude and bilaterally, action tremor, no pronator drift   Can't  smell excrements,   Coffee - lost taste and smell.  Smoke - less sensitive.    Social History   Socioeconomic History  . Marital status: Married    Spouse name: Not on file  . Number of children: Not on file  . Years of education: Not on file  . Highest education level: Not on file  Occupational History  . Not on file  Tobacco Use  . Smoking status: Never Smoker  . Smokeless tobacco: Never Used  Substance and Sexual Activity  . Alcohol use: Yes    Comment: occ  . Drug use: Never  . Sexual activity: Not on file  Other Topics Concern  . Not on file  Social History Narrative  . Not on file   Social Determinants of Health   Financial Resource Strain: Not on file  Food Insecurity: Not on file  Transportation Needs: Not on file  Physical Activity: Not on file  Stress: Not on file  Social Connections: Not on file    Family History  Problem Relation Age of Onset  . Cancer Mother   . Alzheimer's disease Mother   . Hypertension Father   . Cancer Father   . Alzheimer's disease Maternal Grandmother   . Heart disease Maternal Grandfather   . Alzheimer's disease Paternal Grandmother   .  Prostate cancer Paternal Grandfather     Past Medical History:  Diagnosis Date  . Hypertension     Past Surgical History:  Procedure Laterality Date  . APPENDECTOMY       Current Outpatient Medications on File Prior to Visit  Medication Sig Dispense Refill  . amLODipine-valsartan (EXFORGE) 10-160 MG tablet Take 1 tablet by mouth daily.    . Ascorbic Acid (VITAMIN C) 500 MG CAPS Take by mouth.    Marland Kitchen b complex vitamins capsule Take 1 capsule by mouth daily.    Marland Kitchen buPROPion (WELLBUTRIN XL) 150 MG 24 hr tablet Take 150 mg by mouth at bedtime.    . clobetasol cream (TEMOVATE) 0.05 % Apply topically 2 (two) times daily as needed.    . Fluticasone Propionate HFA (FLOVENT HFA IN) Inhale into the lungs.    . lansoprazole (PREVACID SOLUTAB) 30 MG disintegrating tablet Take 30 mg by mouth  daily at 12 noon.    . Multiple Vitamin (MULTI-VITAMIN DAILY PO) Take by mouth.    . Zinc 50 MG TABS Take by mouth.     No current facility-administered medications on file prior to visit.    Allergies  Allergen Reactions  . Clotrimazole-Betamethasone Rash    Physical exam:  Today's Vitals   03/31/20 1359  BP: (!) 139/91  Pulse: 81  Weight: 180 lb (81.6 kg)  Height: 5\' 11"  (1.803 m)   Body mass index is 25.1 kg/m.   Wt Readings from Last 3 Encounters:  03/31/20 180 lb (81.6 kg)  01/26/20 182 lb 12.8 oz (82.9 kg)  01/11/20 182 lb 0.1 oz (82.6 kg)     Ht Readings from Last 3 Encounters:  03/31/20 5\' 11"  (1.803 m)  01/26/20 5\' 11"  (1.803 m)  01/11/20 5\' 11"  (1.803 m)      General: The patient is awake, alert and appears not in acute distress. The patient is well groomed. Head: Normocephalic, atraumatic. Neck is supple. Mallampati 2- ,  neck circumference:17 inches .  Nasal airflow  patent.  Retrognathia is not  seen.  Dental status:  Cardiovascular:  Regular rate and cardiac rhythm by pulse,  without distended neck veins. Respiratory: Lungs are clear to auscultation.  Skin:  Without evidence of ankle edema, or rash. Trunk: The patient's posture is erect.   Neurologic exam : The patient is awake and alert, oriented to place and time.   Memory subjective described as intact.  Attention span & concentration ability appears normal.  Speech is fluent,  without  dysarthria, dysphonia or aphasia.  Mood and affect are appropriate.   Cranial nerves: no loss of smell or taste reported  Pupils are equal and briskly reactive to light. Funduscopic exam deferred.  Extraocular movements in vertical and horizontal planes were intact and without nystagmus. No Diplopia. Visual fields by finger perimetry - Right eye vision loss from birth - amblyopia.  Hearing was intact to soft voice and finger rubbing.    Facial sensation intact to fine touch.  Facial motor strength is  symmetric and tongue and uvula move midline.  Neck ROM : rotation, tilt and flexion extension were normal for age and shoulder shrug was symmetrical.    Motor exam:  Symmetric bulk, tone and ROM.   Normal tone without cog wheeling, symmetric grip strength .   Sensory:  Fine touch, pinprick and vibration were tested  and  normal.  Proprioception tested in the upper extremities was normal.   Coordination: Rapid alternating movements in the fingers/hands were of  normal speed.  The Finger-to-nose maneuver was intact without evidence of ataxia, dysmetria or tremor.   Gait and station: Patient could rise unassisted from a seated position, walked without assistive device.  Stance is of normal width/ base .  Toe and heel walk were deferred.  Deep tendon reflexes: in the  upper and lower extremities are symmetric and intact.  Babinski response was deferred.        After spending a total time of 30 minutes face to face and additional time for physical and neurologic examination, review of laboratory studies,  personal review of imaging studies, reports and results of other testing and review of referral information / records as far as provided in visit, I have established the following assessments:  1)  There is a good recovery of SMELL AND TASTE -  He already can identify scents in all categories.  2)  Tremor is minimal, essential.  3)  Recovering  concentration- he reports fogginess, affecting word finding and multitasking.  Milder aphasia.    My Plan is to proceed with:  1) continue [place] for aphasia.     I would like to thank [facility] Mitzi Hansen, Np [address] [place],  [place] [address] for allowing me to meet with and to take care of this pleasant patient.   In short, SKIP LITKE is presenting with post covid symptoms, he will need to continue Aphasia.  He lost his father of cancer - in January and was not told earlier of his condition. I  recommend to increase Wellbutrin 2 times in am 150 mg XR.  I plan to follow up either personally prn .   CC: I will share my notes with PCP.   Electronically signed by: Melvyn Novas, MD 03/31/2020 2:30 PM  [facility] Board certified by The [employer] of Sleep Medicine and Diplomate of the [employer] of Sleep Medicine. Board certified In Neurology through the ABPN, Fellow of the [employer] of Neurology. Medical Director of [employer].

## 2020-04-18 ENCOUNTER — Other Ambulatory Visit: Payer: Self-pay

## 2020-04-18 ENCOUNTER — Ambulatory Visit: Payer: Federal, State, Local not specified - PPO | Attending: Nurse Practitioner

## 2020-04-18 DIAGNOSIS — R41841 Cognitive communication deficit: Secondary | ICD-10-CM | POA: Diagnosis not present

## 2020-04-18 NOTE — Therapy (Signed)
[facility] [address] [place], [place], [address] Phone: [phone]   Fax:  [phone]  Speech Language Pathology Treatment  Patient Details  Name: Mario Bryan MRN: [id number] Date of Birth: 1960-07-18 Referring Provider (SLP): Angus Seller, NP   Encounter Date: 04/18/2020   End of Session - 04/18/20 1642    Visit Number 2    Number of Visits 17    Date for SLP Re-Evaluation 06/20/20    SLP Start Time 1530    SLP Stop Time  1616    SLP Time Calculation (min) 46 min    Activity Tolerance Patient tolerated treatment well           Past Medical History:  Diagnosis Date  . Hypertension     Past Surgical History:  Procedure Laterality Date  . APPENDECTOMY      There were no vitals filed for this visit.   Subjective Assessment - 04/18/20 1530    Subjective "It's gotten better" re: brain Fog d/t COVID                 ADULT SLP TREATMENT - 04/18/20 1534      General Information   Behavior/Cognition Alert;Cooperative;Pleasant mood      Cognitive-Linquistic Treatment   Treatment focused on Cognition    Skilled Treatment Pt reports continued "brain fog" s/p COVID-19 ~1 year ago. Pt provided examples of decreased attention/memory related to work, including forgetting if a package has been delivered or not. Pt indicated he has to double check for errors at work 3-4x/day compared to 1-2x/day prior to COVID. CLQT completed this session, with all scores WNL; however, pt demonstrated increased difficulty with symbol trails (connected 2 shapes at time) and generative naming for M (4 words). Pt stated he was too fixated on rules (no capitals), which impacted performance. SLP reviewed memory and attention compensations for home and work environments, with handout provided.      Assessment / Recommendations / Plan   Plan Continue with current plan of care      Progression Toward Goals   Progression  toward goals Progressing toward goals            SLP Education - 04/18/20 1641    Education Details compensations for memory/attention, CLQT results    Person(s) Educated Patient    Methods Explanation;Handout;Demonstration    Comprehension Verbalized understanding;Returned demonstration;Need further instruction            SLP Short Term Goals - 04/18/20 1645      SLP SHORT TERM GOAL #1   Title pt will use memory compensations for appointments, meds, and other daily activities with min A between or in 3 sessions    Baseline 04-18-20    Time 6    Period Weeks    Status On-going      SLP SHORT TERM GOAL #2   Title pt will demonstrate selective attention for 15 minutes of cognitive linguistic tasks in a mod noisy environment    Time 6    Period Weeks    Status On-going      SLP SHORT TERM GOAL #3   Title pt will complete formal cognitive linguistic assessment    Status Achieved            SLP Long Term Goals - 04/18/20 1646      SLP LONG TERM GOAL #1   Title pt will independently use memory compensations for appointments, meds, work responsibiliites, to-do lists, and  other daily activities between or in 2 sessions    Baseline 04-18-20    Time 12    Period Weeks   or 13 total sessions   Status On-going      SLP LONG TERM GOAL #2   Title pt will demo 15 minutes alternating attention between 2 mod complex cognitive linguisitic tasks with compensations/modified independence in 2 sessions    Time 12    Period Weeks    Status On-going            Plan - 04/18/20 1642    Clinical Impression Statement 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 CLQT completed with all scores WNL; however, errors demonstrated for symbols trails and increased difficulty with abstract generative naming. Pt reports persistent deficits impacting performance at work and home, requiring increased frequency of double checking to ensure accuracy. SLP  targeted use of external aids for compensation, in which pt utilizes [employer] Calendar and alarms as recommended during SLP eval. Pt would benefit from continuation of ST services to address cognitive linguistic skills to aid in return to PLOF.    Speech Therapy Frequency 2x / week   Pt requested 1x/week due to work   Duration 8 weeks    Treatment/Interventions Functional tasks;SLP instruction and feedback;Cueing hierarchy;Internal/external aids;Patient/family education;Compensatory strategies;Cognitive reorganization;Environmental controls    Potential to Achieve Goals Fair    SLP Home Exercise Plan attention strats    Consulted and Agree with Plan of Care Patient           Patient will benefit from skilled therapeutic intervention in order to improve the following deficits and impairments:   Cognitive communication deficit    Problem List Patient Active Problem List   Diagnosis Date Noted  . COVID-19 long hauler 03/31/2020  . Chest pain of uncertain etiology 01/26/2020  . DOE (dyspnea on exertion) 01/26/2020  . Essential hypertension 01/26/2020  . History of COVID-19 01/11/2020  . Cognitive impairment 01/11/2020  . 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 Snoring 01/11/2020  . Shortness of breath 01/11/2020  . Rash 01/11/2020    Janann Colonel, MA CCC-SLP 04/18/2020, 4:47 PM  [facility] [address] [place], [place], [address] Phone: [phone]   Fax:  [phone]   Name: KODIE KISHI MRN: [id number] Date of Birth: 09-Dec-1960

## 2020-04-18 NOTE — Patient Instructions (Signed)

## 2020-04-20 ENCOUNTER — Ambulatory Visit: Payer: Federal, State, Local not specified - PPO

## 2020-04-25 ENCOUNTER — Ambulatory Visit: Payer: Federal, State, Local not specified - PPO

## 2020-04-28 ENCOUNTER — Other Ambulatory Visit: Payer: Self-pay

## 2020-04-28 ENCOUNTER — Encounter: Payer: Self-pay | Admitting: Internal Medicine

## 2020-04-28 ENCOUNTER — Ambulatory Visit: Payer: Federal, State, Local not specified - PPO | Admitting: Internal Medicine

## 2020-04-28 VITALS — BP 128/78 | HR 62 | Ht 71.0 in | Wt 180.8 lb

## 2020-04-28 DIAGNOSIS — I10 Essential (primary) hypertension: Secondary | ICD-10-CM

## 2020-04-28 DIAGNOSIS — Z8616 Personal history of COVID-19: Secondary | ICD-10-CM

## 2020-04-28 NOTE — Progress Notes (Signed)
Cardiology Office Note:    Date:  04/28/2020   ID:  Mario Bryan, DOB [birth year], MRN [id number]  PCP:  [facility] Cardiologist:  Christell Constant, MD  [facility] Electrophysiologist:  None   CC: Follow up SOB  History of Present Illness:    Mario Bryan is a 60 y.o. male with a hx of HTN, Prior COVID-19 (seen in post-COVID clinic) with residual shortness of breath seen 01/26/20.  In interim of this visit, patient normal echo (query of AA dilation but normal on subsequent CT).  Seen 04/28/20.  Patient notes that he is doing feeling pretty good.  Since last visit notes that his father passed away 03-07-2020- this has been stressful.  Relevant interval testing or therapy include nothing outside of the echo and CT.  There are no interval hospital/ED visit.    No chest pain or pressure.  Notes SOB only on rare occasion.  No Palpitations.  Has started a graded exercise program  Ambulatory blood pressure 120/80.   Past Medical History:  Diagnosis Date  . Chest pain of uncertain etiology 01/26/2020  . Cognitive impairment 01/11/2020  . 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 DOE (dyspnea on exertion) 01/26/2020  . Hypertension   . Rash 01/11/2020  . Shortness of breath 01/11/2020  . Snoring 01/11/2020    Past Surgical History:  Procedure Laterality Date  . APPENDECTOMY      Current Medications: Current Meds  Medication Sig  . amLODipine-valsartan (EXFORGE) 10-160 MG tablet Take 1 tablet by mouth daily.  . Ascorbic Acid (VITAMIN C) 500 MG CAPS Take by mouth.  Marland Kitchen b complex vitamins capsule Take 1 capsule by mouth daily.  Marland Kitchen buPROPion (WELLBUTRIN XL) 150 MG 24 hr tablet Take 2 tablets (300 mg total) by mouth in the morning.  . clobetasol cream (TEMOVATE) 0.05 % Apply topically 2 (two) times daily as needed.  . Fluticasone Propionate HFA (FLOVENT HFA IN) Inhale into the lungs.  . lansoprazole  (PREVACID SOLUTAB) 30 MG disintegrating tablet Take 30 mg by mouth as needed.  . Multiple Vitamin (MULTI-VITAMIN DAILY PO) Take by mouth.  . Zinc 50 MG TABS Take by mouth.     Allergies:   Clotrimazole-betamethasone   Social History   Socioeconomic History  . Marital status: Married    Spouse name: Not on file  . Number of children: Not on file  . Years of education: Not on file  . Highest education level: Not on file  Occupational History  . Not on file  Tobacco Use  . Smoking status: Never Smoker  . Smokeless tobacco: Never Used  Substance and Sexual Activity  . Alcohol use: Yes    Comment: occ  . Drug use: Never  . Sexual activity: Not on file  Other Topics Concern  . Not on file  Social History Narrative  . Not on file   Social Determinants of Health   Financial Resource Strain: Not on file  Food Insecurity: Not on file  Transportation Needs: Not on file  Physical Activity: Not on file  Stress: Not on file  Social Connections: Not on file    Social : post office worker  Family History: The patient's family history includes Alzheimer's disease in his maternal grandmother, mother, and paternal grandmother; Cancer in his father and mother; Heart disease in his maternal grandfather; Hypertension in his father; Prostate cancer in his paternal grandfather. Grandfather may  have had MI vs alcohol abuse, unclear. No history of HCM No history of SCD/drownings/car accidents.  ROS:   Please see the history of present illness.    All other systems reviewed and are negative.  EKGs/Labs/Other Studies Reviewed:    The following studies were reviewed today:  EKG:  01/26/20: sinus bradycardia with borderline LAE  Transthoracic Echocardiogram: Date: 02/23/20 Results: IMPRESSIONS  1. Left ventricular ejection fraction, by estimation, is 60 to 65%. Left  ventricular ejection fraction by 3D volume is 70 %. The left ventricle has  normal function. The left ventricle has  no regional wall motion  abnormalities. Left ventricular diastolic  parameters are consistent with Grade I diastolic dysfunction (impaired  relaxation).  2. Right ventricular systolic function is normal. The right ventricular  size is normal. There is normal pulmonary artery systolic pressure.  3. The mitral valve is normal in structure. No evidence of mitral valve  regurgitation. No evidence of mitral stenosis.  4. The aortic valve is normal in structure. Aortic valve regurgitation is  not visualized. No aortic stenosis is present.  5. Aortic dilatation noted. There is mild dilatation at the level of the  sinuses of Valsalva, measuring 40 mm. There is borderline dilatation of  the ascending aorta, measuring 37 mm.  6. The inferior vena cava is normal in size with greater than 50%  respiratory variability, suggesting right atrial pressure of 3 mmHg.   Cardiac CT : Date: 02/24/20 Results: IMPRESSION: 1. Coronary calcium score of 0. This was 1st percentile for age, sex, and race matched control. 2. Normal coronary origin.  Right dominance. 3. CAD-RADS 0. No evidence of CAD (0%). Consider non-atherosclerotic causes of chest pain.  Recent Labs: 02/23/2020: BUN 15; Creatinine, Ser 0.83; Potassium 4.3; Sodium 139  Recent Lipid Panel No results found for: CHOL, TRIG, HDL, CHOLHDL, VLDL, LDLCALC, LDLDIRECT   Risk Assessment/Calculations:     ASCVD risk estimated at 8.8%  Physical Exam:    VS:  BP 128/78   Pulse 62   Ht 5\' 11"  (1.803 m)   Wt 180 lb 12.8 oz (82 kg)   SpO2 97%   BMI 25.22 kg/m     Wt Readings from Last 3 Encounters:  04/28/20 180 lb 12.8 oz (82 kg)  03/31/20 180 lb (81.6 kg)  01/26/20 182 lb 12.8 oz (82.9 kg)   GEN: Well nourished, well developed in no acute distress HEENT: Normal NECK: No JVD; No carotid bruits LYMPHATICS: No lymphadenopathy CARDIAC: RRR, no murmurs, rubs, gallops RESPIRATORY:  Clear to auscultation without rales, wheezing or  rhonchi  ABDOMEN: Soft, non-tender, non-distended MUSCULOSKELETAL:  No edema; No deformity  SKIN: Warm and dry NEUROLOGIC:  Alert and oriented x 3 PSYCHIATRIC:  Normal affect   ASSESSMENT:    1. Essential hypertension    PLAN:    In order of problems listed above:  Essential Hypertension History Of COVID-19 Long Haul - ambulatory blood pressure 120/80, will continue ambulatory BP monitoring; gave education on how to perform ambulatory blood pressure monitoring including the frequency and technique; goal ambulatory blood pressure < 135/85 on average - continue home medications  - discussed diet (DASH/low sodium), and exercise/weight loss interventions   Cardiac Preventive Visit AHA Life's Simple Seven- People with at least five ideal Life's Simple 7 metrics had a 78% reduced risk for heart-related death compared to people with no ideal metrics. - Discussed recommendation of Mediterranean and Dash Diets - Discussed recommendations for 150 minutes weekly exercise - Discussed the important of  blood sugar and blood pressure control - stress the important of a smoke free environment - though BMI is not a perfect measurement in all patient populations; a BMI < 30 can improve cardiac outcomes; present BMI is 25 - Reviewed CT with patient - Will Lipids Fasting in one year  One year follow up unless new symptoms or abnormal test results warranting change in plan  Would be reasonable for  Video Visit Follow up Would be reasonable for  APP Follow up   Medication Adjustments/Labs and Tests Ordered: Current medicines are reviewed at length with the patient today.  Concerns regarding medicines are outlined above.  No orders of the defined types were placed in this encounter.  No orders of the defined types were placed in this encounter.   There are no Patient Instructions on file for this visit.   Signed, Christell Constant, MD  04/28/2020 10:16 AM    [facility]  [facility]

## 2020-04-28 NOTE — Patient Instructions (Signed)
Medication Instructions:  Your physician recommends that you continue on your current medications as directed. Please refer to the Current Medication list given to you today.  *If you need a refill on your cardiac medications before your next appointment, please call your pharmacy*   Lab Work: NONE If you have labs (blood work) drawn today and your tests are completely normal, you will receive your results only by: . MyChart Message (if you have MyChart) OR . A paper copy in the mail If you have any lab test that is abnormal or we need to change your treatment, we will call you to review the results.   Testing/Procedures: NONE   Follow-Up: At [facility], you and your health needs are our priority.  As part of our continuing mission to provide you with exceptional heart care, we have created designated Provider Care Teams.  These Care Teams include your primary Cardiologist (physician) and Advanced Practice Providers (APPs -  Physician Assistants and Nurse Practitioners) who all work together to provide you with the care you need, when you need it.  We recommend signing up for the patient portal called "MyChart".  Sign up information is provided on this After Visit Summary.  MyChart is used to connect with patients for Virtual Visits (Telemedicine).  Patients are able to view lab/test results, encounter notes, upcoming appointments, etc.  Non-urgent messages can be sent to your provider as well.   To learn more about what you can do with MyChart, go to [email or web address].    Your next appointment:   1 year(s)  The format for your next appointment:   In Person  Provider:   You may see Mahesh A Chandrasekhar, MD or one of the following Advanced Practice Providers on your designated Care Team:    Dayna Dunn, PA-C  Michele Lenze, PA-C       

## 2020-05-02 ENCOUNTER — Ambulatory Visit: Payer: Federal, State, Local not specified - PPO

## 2020-05-09 ENCOUNTER — Other Ambulatory Visit: Payer: Self-pay

## 2020-05-09 ENCOUNTER — Ambulatory Visit: Payer: Federal, State, Local not specified - PPO | Attending: Nurse Practitioner

## 2020-05-09 DIAGNOSIS — R41841 Cognitive communication deficit: Secondary | ICD-10-CM | POA: Insufficient documentation

## 2020-05-09 NOTE — Therapy (Signed)
[facility] [address] [place], [place], [address] Phone: [phone]   Fax:  [phone]  Speech Language Pathology Treatment  Patient Details  Name: Mario Bryan MRN: [id number] Date of Birth: Jul 30, 1960 Referring Provider (SLP): Angus Seller, NP   Encounter Date: 05/09/2020   End of Session - 05/09/20 1510    Visit Number 3    Number of Visits 17    Date for SLP Re-Evaluation 06/20/20    SLP Start Time 1408    SLP Stop Time  1446    SLP Time Calculation (min) 38 min    Activity Tolerance Patient tolerated treatment well           Past Medical History:  Diagnosis Date  . Chest pain of uncertain etiology 01/26/2020  . Cognitive impairment 01/11/2020  . 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 DOE (dyspnea on exertion) 01/26/2020  . Hypertension   . Rash 01/11/2020  . Shortness of breath 01/11/2020  . Snoring 01/11/2020    Past Surgical History:  Procedure Laterality Date  . APPENDECTOMY      There were no vitals filed for this visit.   Subjective Assessment - 05/09/20 1413    Subjective Checked in 7 minutes late. Pt has been using Siri for timers and alarms.    Currently in Pain? No/denies                 ADULT SLP TREATMENT - 05/09/20 1408      General Information   Behavior/Cognition Alert;Cooperative;Pleasant mood      Cognitive-Linquistic Treatment   Treatment focused on Cognition    Skilled Treatment "Sorry I had to cancel an appointment the last time." Pt reports no increase in errors at work since last session. Mario Bryan provides a specific example of "pausing and slowing down" for a compensation when he catches himself "daydreaming" and that he is noticing himself "daydreaming" much less than immediately before his ST eval. Pt told SLP that there is a new tracking method for tracking carrier activity and this has assisted him in his accuracy.  At home pt is demonstrating decr'd alternating attention and is demonstrating reduced memory for information told to his wife. SLP suggested a schedule for tasks at home in order to keep pt on target, for evenings and/or weekends. Mario Bryan said he set some Siri reminders for alarms over the weekend. Pt said he used to work off a paper/pen schedule and could do this again. Pt is using MyChart and Google calendar for appointments and has utilized phone (alarms, reminders) for meds and other tasks at home.      Assessment / Recommendations / Plan   Plan Continue with current plan of care      Progression Toward Goals   Progression toward goals Progressing toward goals            SLP Education - 05/09/20 1442    Education Details constnat therapy, compensating with schedules and lists to keep pt on track in evenings and weekends    Person(s) Educated Patient    Methods Explanation    Comprehension Verbalized understanding            SLP Short Term Goals - 05/09/20 1426      SLP SHORT TERM GOAL #1   Title pt will use memory compensations for appointments, meds, and other daily activities with min A between or in 3 sessions    Baseline 04-18-20, 05-09-20  Time 5    Period Weeks    Status On-going      SLP SHORT TERM GOAL #2   Title pt will demonstrate selective attention for 15 minutes of cognitive linguistic tasks in a mod noisy environment    Time 5    Period Weeks    Status On-going      SLP SHORT TERM GOAL #3   Title pt will complete formal cognitive linguistic assessment    Status Achieved            SLP Long Term Goals - 05/09/20 1513      SLP LONG TERM GOAL #1   Title pt will independently use memory compensations for appointments, meds, work responsibiliites, to-do lists, and other daily activities between or in 2 sessions    Baseline 04-18-20    Period --   or 13 total sessions   Status Achieved      SLP LONG TERM GOAL #2   Title pt will demo 15 minutes  alternating attention between 2 mod complex cognitive linguisitic tasks with compensations/modified independence in 2 sessions    Time 11    Period Weeks    Status On-going            Plan - 05/09/20 1511    Clinical Impression Statement 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 Pt reports persistent, but decreasing in frequency, deficits impacting performance at work and home, requiring increased frequency of double checking to ensure accuracy. SLP again targeted use of external aids for compensation. I provided Mario Bryan information on Constant Therapy today and suggested at least 60 minutes work with this every day for 14 days and then assess whether he would want to cont with the app. Pt would benefit from continuation of ST services to address cognitive linguistic skills to aid in return to PLOF. As pt chose a therapy frequency best suited for targeting compensations, suspect pt will be ready for d/c in 2-3 more visits.    Speech Therapy Frequency 2x / week   Pt requested 1x/week due to work   Duration 8 weeks    Treatment/Interventions Functional tasks;SLP instruction and feedback;Cueing hierarchy;Internal/external aids;Patient/family education;Compensatory strategies;Cognitive reorganization;Environmental controls    Potential to Achieve Goals Fair    SLP Home Exercise Plan attention strats    Consulted and Agree with Plan of Care Patient           Patient will benefit from skilled therapeutic intervention in order to improve the following deficits and impairments:   Cognitive communication deficit    Problem List Patient Active Problem List   Diagnosis Date Noted  . COVID-19 long hauler 03/31/2020  . Chest pain of uncertain etiology 01/26/2020  . DOE (dyspnea on exertion) 01/26/2020  . Essential hypertension 01/26/2020  . History of COVID-19 01/11/2020  . Cognitive impairment 01/11/2020  . COVID-19 long hauler manifesting chronic loss  of smell and taste 01/11/2020  . 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 Snoring 01/11/2020  . Shortness of breath 01/11/2020  . Rash 01/11/2020    Sarita Hakanson ,MS, CCC-SLP  05/09/2020, 3:14 PM  [facility] [address] [place], [place], [address] Phone: [phone]   Fax:  [phone]   Name: Mario Bryan MRN: [id number] Date of Birth: April 23, 1960

## 2020-05-09 NOTE — Patient Instructions (Signed)
    Good that you are noticing the "daydreaming" a lot less than prior to your evaluation with [place].  Think about making a schedule for each evening and/or the weekends in order to keep you more on task during those times.   "Constant Therapy" is the app for attention that I mentioned - everything is free for 14 days. I would suggest doing this about an hour a day, two hours/day on weekends. This can help your concentration/focus/attention. After 14 days they ask for a credit card to begin your subscription - but if you like you can use a code to reduce the monthly cost. Sometimes patients use it for a month and find it very helpful.  Good News! Your clients can SAVE 15% on any new Constant Therapy subscription. Tell them to use code GROW when they subscribe

## 2020-05-16 ENCOUNTER — Ambulatory Visit: Payer: Federal, State, Local not specified - PPO

## 2020-05-19 ENCOUNTER — Ambulatory Visit: Payer: Federal, State, Local not specified - PPO

## 2020-05-19 ENCOUNTER — Other Ambulatory Visit: Payer: Self-pay

## 2020-05-19 DIAGNOSIS — R41841 Cognitive communication deficit: Secondary | ICD-10-CM | POA: Diagnosis not present

## 2020-05-19 NOTE — Therapy (Signed)
[facility] [address] [place], [place], [address] Phone: [phone]   Fax:  [phone]  Speech Language Pathology Treatment  Patient Details  Name: Mario Bryan MRN: [id number] Date of Birth: Mar 06, 1960 Referring Provider (SLP): Angus Seller, NP   Encounter Date: 05/19/2020   End of Session - 05/19/20 1812    Visit Number 4    Number of Visits 17    Date for SLP Re-Evaluation 06/20/20    SLP Start Time 1745    SLP Stop Time  1830    SLP Time Calculation (min) 45 min    Activity Tolerance Patient tolerated treatment well           Past Medical History:  Diagnosis Date  . Chest pain of uncertain etiology 01/26/2020  . Cognitive impairment 01/11/2020  . 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 DOE (dyspnea on exertion) 01/26/2020  . Hypertension   . Rash 01/11/2020  . Shortness of breath 01/11/2020  . Snoring 01/11/2020    Past Surgical History:  Procedure Laterality Date  . APPENDECTOMY      There were no vitals filed for this visit.          ADULT SLP TREATMENT - 05/19/20 1747      General Information   Behavior/Cognition Alert;Cooperative;Pleasant mood      Cognitive-Linquistic Treatment   Treatment focused on Cognition;Patient/family/caregiver education    Skilled Treatment Pt reports he has been completing Constant Therapy, which has been helpful. SLP reminded patient of discount code provided last session if he desires to continue CT. 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 Pt reports he has missed some transacations; however, improvements overall endorsed. Pt aware of need for selective focus and accuracy in order to complete task as required. Pt stated "I am not as brain foggy as I was." Pt reported he suspects some baseline attention deficits with specific examples required; however, no  formal diagnosis received. SLP completed analysis of daily schedule for work day versus non-work days, with good attention to detail exhibited. Pt continues to use phone as external memory aid, with good success reported. No incidences of missed medication endorsed.      Assessment / Recommendations / Plan   Plan Continue with current plan of care      Progression Toward Goals   Progression toward goals Progressing toward goals            SLP Education - 05/19/20 1834    Education Details constant therapy, ways to reduce multi-tasking at home, written schedule    Person(s) Educated Patient    Methods Explanation;Verbal cues    Comprehension Verbalized understanding;Returned demonstration;Need further instruction            SLP Short Term Goals - 05/19/20 1813      SLP SHORT TERM GOAL #1   Title pt will use memory compensations for appointments, meds, and other daily activities with min A between or in 3 sessions    Baseline 04-18-20, 05-09-20, 05-19-20    Time 4    Period --    Status Achieved      SLP SHORT TERM GOAL #2   Title pt will demonstrate selective attention for 15 minutes of cognitive linguistic tasks in a mod noisy environment    Time 4    Period Weeks    Status On-going      SLP SHORT TERM  GOAL #3   Title pt will complete formal cognitive linguistic assessment    Status Achieved            SLP Long Term Goals - 05/19/20 1815      SLP LONG TERM GOAL #1   Title pt will independently use memory compensations for appointments, meds, work responsibiliites, to-do lists, and other daily activities between or in 2 sessions    Baseline 04-18-20    Period --   or 13 total sessions   Status Achieved      SLP LONG TERM GOAL #2   Title pt will demo 15 minutes alternating attention between 2 mod complex cognitive linguisitic tasks with compensations/modified independence in 2 sessions    Time 10    Period Weeks    Status On-going            Plan - 05/19/20  1835    Clinical Impression Statement 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 Pt reports he has utilized Constant Therapy for 3 days since trial began, instead of previously recommended 60 minutes work  for 14 days. SLP again provided discount code if pt desires to continue with CT as pt reported good engagement. SLP reviewed how to implement compensations for daily activities, with recommendation to reduce multi-tasking to improve accuracy and efficiency. Pt would benefit from continuation of ST services to address cognitive linguistic skills to aid in return to PLOF. As pt chose a therapy frequency best suited for targeting compensations, suspect pt will be ready for d/c in 1-2 more visits.    Speech Therapy Frequency 2x / week   Pt requested 1x/week due to work   Duration 8 weeks   or 17 total visits   Treatment/Interventions Functional tasks;SLP instruction and feedback;Cueing hierarchy;Internal/external aids;Patient/family education;Compensatory strategies;Cognitive reorganization;Environmental controls    Potential to Achieve Goals Fair    SLP Home Exercise Plan provided    Consulted and Agree with Plan of Care Patient           Patient will benefit from skilled therapeutic intervention in order to improve the following deficits and impairments:   Cognitive communication deficit    Problem List Patient Active Problem List   Diagnosis Date Noted  . COVID-19 long hauler 03/31/2020  . Chest pain of uncertain etiology 01/26/2020  . DOE (dyspnea on exertion) 01/26/2020  . Essential hypertension 01/26/2020  . History of COVID-19 01/11/2020  . Cognitive impairment 01/11/2020  . 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 Snoring 01/11/2020  . Shortness of breath 01/11/2020  . Rash 01/11/2020    Janann Colonel, MA  CCC-SLP 05/19/2020, 6:38 PM  [facility] [address] [place], [place], [address] Phone: [phone]   Fax:  [phone]   Name: Mario Bryan MRN: [id number] Date of Birth: [birth year]

## 2020-05-30 ENCOUNTER — Ambulatory Visit: Payer: Federal, State, Local not specified - PPO

## 2020-06-02 ENCOUNTER — Other Ambulatory Visit: Payer: Self-pay

## 2020-06-02 ENCOUNTER — Ambulatory Visit: Payer: Federal, State, Local not specified - PPO | Attending: Nurse Practitioner

## 2020-06-02 DIAGNOSIS — R41841 Cognitive communication deficit: Secondary | ICD-10-CM | POA: Insufficient documentation

## 2020-06-02 NOTE — Therapy (Signed)
[facility] [address] [place], [place], [address] Phone: [phone]   Fax:  [phone]  Speech Language Pathology Treatment-Discharge Summary  Patient Details  Name: NIKHIL OSEI MRN: [id number] Date of Birth: Nov 18, 1960 Referring Provider (SLP): Lazaro Arms, NP   Encounter Date: 06/02/2020   End of Session - 06/02/20 1810    Visit Number 5    Number of Visits 17    Date for SLP Re-Evaluation 06/20/20    SLP Start Time 9480    SLP Stop Time  1630    SLP Time Calculation (min) 42 min    Activity Tolerance Patient tolerated treatment well           Past Medical History:  Diagnosis Date  . Chest pain of uncertain etiology 16/55/3748  . Cognitive impairment 01/11/2020  . 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 DOE (dyspnea on exertion) 01/26/2020  . Hypertension   . Rash 01/11/2020  . Shortness of breath 01/11/2020  . Snoring 01/11/2020    Past Surgical History:  Procedure Laterality Date  . APPENDECTOMY      There were no vitals filed for this visit.   Subjective Assessment - 06/02/20 1751    Subjective "not bad"    Currently in Pain? No/denies                 ADULT SLP TREATMENT - 06/02/20 1753      General Information   Behavior/Cognition Alert;Cooperative;Pleasant mood      Cognitive-Linquistic Treatment   Treatment focused on Cognition;Patient/family/caregiver education    Skilled Treatment Pt reports he is using [employer] for additional month trial as he forgot to cancel his subscription. Pt reports he continues to benefit from use of compensations educated and trained by SLPs, particularly use of external aids on phone. SLP trialed attention to detail task with time constraints, in which pt able to complete with 96% accuracy with self-corrections x2 with cue to double check. Pt able to demonstrate reduction of distractions, by  turning over phone when notification appeared. Mild to mod environemental noise was not impactful of patient's peformance. Pt verbalized his contentment with current progress and is agreeable to ST discharge at this time as pt has returned to baseline.      Assessment / Recommendations / Plan   Plan Discharge SLP treatment due to (comment)   pt pleased with progress     Progression Toward Goals   Progression toward goals Goals met, education completed, patient discharged from SLP            SLP Education - 06/02/20 1810    Education Details compensations, recommendations to improve functioning at work    [employer]) Educated Patient    Methods Explanation;Demonstration    Comprehension Verbalized understanding;Returned demonstration            SLP Short Term Goals - 06/02/20 1749      SLP SHORT TERM GOAL #1   Title pt will use memory compensations for appointments, meds, and other daily activities with min A between or in 3 sessions    Baseline 04-18-20, 05-09-20, 05-19-20    Status Achieved      SLP SHORT TERM GOAL #2   Title pt will demonstrate selective attention for 15 minutes of cognitive linguistic tasks in a mod noisy environment    Baseline 06-02-20    Time --    Period --    Status Achieved  SLP SHORT TERM GOAL #3   Title pt will complete formal cognitive linguistic assessment    Status Achieved            SLP Long Term Goals - 06/02/20 1749      SLP LONG TERM GOAL #1   Title pt will independently use memory compensations for appointments, meds, work responsibiliites, to-do lists, and other daily activities between or in 2 sessions    Baseline 04-18-20    Period --   or 13 total sessions   Status Achieved      SLP LONG TERM GOAL #2   Title pt will demo 15 minutes alternating attention between 2 mod complex cognitive linguisitic tasks with compensations/modified independence in 2 sessions    Time --    Period --    Status Partially [place]  Visits from Start of Care: 5  Current functional level related to goals / functional outcomes: Pt reports he has returned to baseline and is pleased with current progress since of initiation of ST evaluation to address change in cognitive linguistic skills s/p COVID-19. SLPs have educated and trained compensations for memory and attention, in which pt reports significant benefit utilizing phone as external memory aid. Pt will continue to use [employer] as training tool s/p ST discharge. Pt declined need for any further ST intervention. Pt denied any questions or concerns at this time.    Remaining deficits: Questionable underlying attention deficits   Education / Equipment: Memory and attention compensations, [employer], phone as external memory Plan: Patient agrees to discharge.  Patient goals were partially met. Patient is being discharged due to being pleased with the current functional level.  ?????          Plan - 06/02/20 1811    Clinical Impression Statement Pt presents with improved mild cognitive linguistic deficits related to memory and attention. Pt reports improvements in brain fog and overall cognitive functioning. SLP reviewed how to carryover compensations for daily activities, with recommendation to reduce multi-tasking to improve accuracy and efficiency as well as double check work to reduce errors. Pt is pleased with current progress and is agreeable to ST discharge at this time. No further questions or concerns reported. Pt aware he may request script for OPST evaluation if changes or decline in cognition exhibited.    Treatment/Interventions Functional tasks;SLP instruction and feedback;Cueing hierarchy;Internal/external aids;Patient/family education;Compensatory strategies;Cognitive reorganization;Environmental controls    Potential to [place] and Agree with Plan of Care Patient           Patient will  benefit from skilled therapeutic intervention in order to improve the following deficits and impairments:   Cognitive communication deficit    Problem List Patient Active Problem List   Diagnosis Date Noted  . COVID-19 long hauler 03/31/2020  . Chest pain of uncertain etiology 14/48/1856  . DOE (dyspnea on exertion) 01/26/2020  . Essential hypertension 01/26/2020  . History of COVID-19 01/11/2020  . Cognitive impairment 01/11/2020  . 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 Snoring 01/11/2020  . Shortness of breath 01/11/2020  . Rash 01/11/2020    Alinda Deem, MA CCC-SLP 06/02/2020, 6:35 PM  [facility] [address] [place], [place], [address] Phone: [phone]   Fax:  [phone]   Name:  HRITHIK BOSCHEE MRN: [id number] Date of Birth: Oct 20, 1960

## 2020-07-11 ENCOUNTER — Telehealth: Payer: Self-pay

## 2020-07-11 DIAGNOSIS — Z006 Encounter for examination for normal comparison and control in clinical research program: Secondary | ICD-10-CM

## 2020-07-11 NOTE — Telephone Encounter (Signed)
I have attempted without success to contact this patient by phone for his Identify 90 day follow up phone call. I left a message for patient to return my phone call with my name and callback number. An e-mail was also sent to patient.   

## 2020-07-25 DIAGNOSIS — J069 Acute upper respiratory infection, unspecified: Secondary | ICD-10-CM | POA: Diagnosis not present

## 2020-07-25 DIAGNOSIS — U071 COVID-19: Secondary | ICD-10-CM | POA: Diagnosis not present

## 2020-07-26 ENCOUNTER — Telehealth: Payer: Self-pay

## 2020-07-26 DIAGNOSIS — Z006 Encounter for examination for normal comparison and control in clinical research program: Secondary | ICD-10-CM

## 2020-07-26 NOTE — Telephone Encounter (Signed)
I called patient for his 90-day Identify Study follow up phone call. Patient is doing well with no cardiac symptoms at this time. I reminded patient I would call him in December for his 1 year follow-up. 

## 2020-10-03 ENCOUNTER — Other Ambulatory Visit: Payer: Self-pay | Admitting: Neurology

## 2020-11-06 DIAGNOSIS — J069 Acute upper respiratory infection, unspecified: Secondary | ICD-10-CM | POA: Diagnosis not present

## 2020-11-21 DIAGNOSIS — Z23 Encounter for immunization: Secondary | ICD-10-CM | POA: Diagnosis not present

## 2020-11-21 DIAGNOSIS — Z1331 Encounter for screening for depression: Secondary | ICD-10-CM | POA: Diagnosis not present

## 2020-11-21 DIAGNOSIS — U099 Post covid-19 condition, unspecified: Secondary | ICD-10-CM | POA: Diagnosis not present

## 2020-11-28 DIAGNOSIS — Z125 Encounter for screening for malignant neoplasm of prostate: Secondary | ICD-10-CM | POA: Diagnosis not present

## 2020-11-28 DIAGNOSIS — I10 Essential (primary) hypertension: Secondary | ICD-10-CM | POA: Diagnosis not present

## 2020-12-05 DIAGNOSIS — Z0000 Encounter for general adult medical examination without abnormal findings: Secondary | ICD-10-CM | POA: Diagnosis not present

## 2020-12-05 DIAGNOSIS — Z1331 Encounter for screening for depression: Secondary | ICD-10-CM | POA: Diagnosis not present

## 2020-12-05 DIAGNOSIS — Z1389 Encounter for screening for other disorder: Secondary | ICD-10-CM | POA: Diagnosis not present

## 2020-12-05 DIAGNOSIS — Z23 Encounter for immunization: Secondary | ICD-10-CM | POA: Diagnosis not present

## 2020-12-05 DIAGNOSIS — I10 Essential (primary) hypertension: Secondary | ICD-10-CM | POA: Diagnosis not present

## 2021-01-13 DIAGNOSIS — R350 Frequency of micturition: Secondary | ICD-10-CM | POA: Diagnosis not present

## 2021-01-13 DIAGNOSIS — R300 Dysuria: Secondary | ICD-10-CM | POA: Diagnosis not present

## 2021-01-13 DIAGNOSIS — R8271 Bacteriuria: Secondary | ICD-10-CM | POA: Diagnosis not present

## 2021-01-13 DIAGNOSIS — N401 Enlarged prostate with lower urinary tract symptoms: Secondary | ICD-10-CM | POA: Diagnosis not present

## 2021-01-13 DIAGNOSIS — R972 Elevated prostate specific antigen [PSA]: Secondary | ICD-10-CM | POA: Diagnosis not present

## 2021-01-22 ENCOUNTER — Other Ambulatory Visit: Payer: Self-pay | Admitting: Internal Medicine

## 2021-01-22 DIAGNOSIS — R072 Precordial pain: Secondary | ICD-10-CM

## 2021-01-27 DIAGNOSIS — M25562 Pain in left knee: Secondary | ICD-10-CM | POA: Diagnosis not present

## 2021-02-11 DIAGNOSIS — M25561 Pain in right knee: Secondary | ICD-10-CM | POA: Diagnosis not present

## 2021-02-15 DIAGNOSIS — S83242A Other tear of medial meniscus, current injury, left knee, initial encounter: Secondary | ICD-10-CM | POA: Diagnosis not present

## 2021-02-28 DIAGNOSIS — Z1211 Encounter for screening for malignant neoplasm of colon: Secondary | ICD-10-CM | POA: Diagnosis not present

## 2021-02-28 DIAGNOSIS — K219 Gastro-esophageal reflux disease without esophagitis: Secondary | ICD-10-CM | POA: Diagnosis not present

## 2021-02-28 DIAGNOSIS — R197 Diarrhea, unspecified: Secondary | ICD-10-CM | POA: Diagnosis not present

## 2021-02-28 DIAGNOSIS — Z800 Family history of malignant neoplasm of digestive organs: Secondary | ICD-10-CM | POA: Diagnosis not present

## 2021-03-02 DIAGNOSIS — R972 Elevated prostate specific antigen [PSA]: Secondary | ICD-10-CM | POA: Diagnosis not present

## 2021-03-31 DIAGNOSIS — M25569 Pain in unspecified knee: Secondary | ICD-10-CM | POA: Diagnosis not present

## 2021-04-05 DIAGNOSIS — S83232A Complex tear of medial meniscus, current injury, left knee, initial encounter: Secondary | ICD-10-CM | POA: Diagnosis not present

## 2021-06-14 DIAGNOSIS — N410 Acute prostatitis: Secondary | ICD-10-CM | POA: Diagnosis not present

## 2021-06-14 DIAGNOSIS — R972 Elevated prostate specific antigen [PSA]: Secondary | ICD-10-CM | POA: Diagnosis not present

## 2021-06-16 DIAGNOSIS — D125 Benign neoplasm of sigmoid colon: Secondary | ICD-10-CM | POA: Diagnosis not present

## 2021-06-16 DIAGNOSIS — K317 Polyp of stomach and duodenum: Secondary | ICD-10-CM | POA: Diagnosis not present

## 2021-06-16 DIAGNOSIS — D123 Benign neoplasm of transverse colon: Secondary | ICD-10-CM | POA: Diagnosis not present

## 2021-06-16 DIAGNOSIS — K648 Other hemorrhoids: Secondary | ICD-10-CM | POA: Diagnosis not present

## 2021-06-16 DIAGNOSIS — R12 Heartburn: Secondary | ICD-10-CM | POA: Diagnosis not present

## 2021-06-16 DIAGNOSIS — K219 Gastro-esophageal reflux disease without esophagitis: Secondary | ICD-10-CM | POA: Diagnosis not present

## 2021-06-16 DIAGNOSIS — K2950 Unspecified chronic gastritis without bleeding: Secondary | ICD-10-CM | POA: Diagnosis not present

## 2021-06-16 DIAGNOSIS — K2930 Chronic superficial gastritis without bleeding: Secondary | ICD-10-CM | POA: Diagnosis not present

## 2021-06-16 DIAGNOSIS — K5730 Diverticulosis of large intestine without perforation or abscess without bleeding: Secondary | ICD-10-CM | POA: Diagnosis not present

## 2021-06-16 DIAGNOSIS — Z1211 Encounter for screening for malignant neoplasm of colon: Secondary | ICD-10-CM | POA: Diagnosis not present

## 2021-06-16 DIAGNOSIS — K2100 Gastro-esophageal reflux disease with esophagitis, without bleeding: Secondary | ICD-10-CM | POA: Diagnosis not present

## 2021-06-21 DIAGNOSIS — N410 Acute prostatitis: Secondary | ICD-10-CM | POA: Diagnosis not present

## 2021-06-21 DIAGNOSIS — R972 Elevated prostate specific antigen [PSA]: Secondary | ICD-10-CM | POA: Diagnosis not present

## 2021-07-17 NOTE — Progress Notes (Unsigned)
Cardiology Office Note:    Date:  07/19/2021   ID:  Mario Bryan 1961/01/22, MRN [id number]  PCP:  [facility] [place] Cardiologist:  Werner Lean, MD  [facility] Electrophysiologist:  None   CC: One year follow up  History of Present Illness:    Mario Bryan is a 61 y.o. male with a hx of HTN, Prior COVID-19 (seen in post-COVID clinic) with residual shortness of breath seen 01/26/20. 2022:   Patient normal echo (query of AA dilation but normal on subsequent CT).    Patient notes that he is doing well.   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 There are no interval hospital/ED visit.    No chest pain or pressure .  No SOB/DOE and no PND/Orthopnea.  No weight gain or leg swelling.  No palpitations or syncope.  Still doesn't have 100% taste.   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   Ascorbic Acid (VITAMIN C) 500 MG CAPS Take by mouth.   b complex vitamins capsule Take 1 capsule by mouth daily.   buPROPion (WELLBUTRIN XL) 150 MG 24 hr tablet Take 2 tablets (300 mg total) by mouth in the morning. (Patient taking differently: Take 150 mg by mouth in the morning.)   clobetasol cream (TEMOVATE) 0.05 % Apply topically 2 (two) times daily as needed.   lansoprazole (PREVACID SOLUTAB) 30 MG disintegrating tablet Take 15 mg by mouth as needed.   Multiple Vitamin (MULTI-VITAMIN DAILY PO) Take by mouth.   Zinc 50 MG TABS Take  by mouth.     Allergies:   Clotrimazole-betamethasone   Social History   Socioeconomic History   Marital status: Married    Spouse name: Not on file   Number of children: Not on file   Years of education: Not on file   Highest education level: Not on file  Occupational History   Not on file  Tobacco Use   Smoking status: Never   Smokeless tobacco: Never  Substance and Sexual Activity   Alcohol use: Yes    Comment: occ   Drug use: Never   Sexual activity: Not on file  Other Topics Concern   Not on file  Social History Narrative   Not on file   Social Determinants of Health   Financial Resource Strain: Not on file  Food Insecurity: Not on file  Transportation Needs: Not on file  Physical Activity: Not on file  Stress: Not on file  Social Connections: Not on file    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 Grandfather may have had MI vs alcohol abuse, unclear. No history of HCM No history of SCD/drownings/car accidents.  ROS:   Please  see the history of present illness.    All other systems reviewed and are negative.  EKGs/Labs/Other Studies Reviewed:    The following studies were reviewed today:  EKG:  07/19/21: SR 60  01/26/20: sinus bradycardia with borderline LAE  Transthoracic Echocardiogram: Date: 02/23/20 Results: IMPRESSIONS   1. Left ventricular ejection fraction, by estimation, is 60 to 65%. Left  ventricular ejection fraction by 3D volume is 70 %. The left ventricle has  normal function. The left ventricle has no regional wall motion  abnormalities. Left ventricular diastolic   parameters are consistent with Grade I diastolic dysfunction (impaired  relaxation).   2. Right  ventricular systolic function is normal. The right ventricular  size is normal. There is normal pulmonary artery systolic pressure.   3. The mitral valve is normal in structure. No evidence of mitral valve  regurgitation. No evidence of mitral stenosis.   4. The aortic valve is normal in structure. Aortic valve regurgitation is  not visualized. No aortic stenosis is present.   5. Aortic dilatation noted. There is mild dilatation at the level of the  sinuses of Valsalva, measuring 40 mm. There is borderline dilatation of  the ascending aorta, measuring 37 mm.   6. The inferior vena cava is normal in size with greater than 50%  respiratory variability, suggesting right atrial pressure of 3 mmHg.   Cardiac CT : Date: 02/24/20 Results: IMPRESSION: 1. Coronary calcium score of 0. This was 1st percentile for age, sex, and race matched control.  2. Normal coronary origin.  Right dominance.  3. CAD-RADS 0. No evidence of CAD (0%). Consider non-atherosclerotic causes of chest pain.   Recent Labs: No results found for requested labs within last 8760 hours.  Recent Lipid Panel No results found for: CHOL, TRIG, HDL, CHOLHDL, VLDL, LDLCALC, LDLDIRECT   Physical Exam:    VS:  BP 138/80   Pulse 60   Ht 5\' 11"  (1.803 m)   Wt 179 lb (81.2 kg)   SpO2 97%   BMI 24.97 kg/m     Wt Readings from Last 3 Encounters:  07/19/21 179 lb (81.2 kg)  04/28/20 180 lb 12.8 oz (82 kg)  03/31/20 180 lb (81.6 kg)   Gen: no distress   Neck: No JVD,  Cardiac: No Rubs or Gallops, no Murmur, RRR +2 radial pulses Respiratory: Clear to auscultation bilaterally, normal effort, normal  respiratory rate GI: Soft, nontender, non-distended  MS: No  edema;  moves all extremities Integument: Skin feels warm Neuro:  At time of evaluation, alert and oriented to person/place/time/situation  Psych: Normal affect, patient feels great   ASSESSMENT:    1. Essential hypertension   2. History of COVID-19   3.  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  Concerns regarding medicines are outlined above.  Orders Placed This Encounter  Procedures   EKG 12-Lead   No orders of the defined types were placed in this encounter.   Patient Instructions  Medication Instructions:  Your physician recommends that you continue on your current medications as directed. Please refer to the Current Medication list given to you today.  *If you need a refill on your cardiac medications before  your next appointment, please call your pharmacy*   Lab Work: NONE If you have labs (blood work) drawn today and your tests are completely normal, you will receive your results only by: [place] (if you have MyChart) OR A paper copy in the mail If you have any lab test that is abnormal or we need to change your treatment, we will call you to review the results.   Testing/Procedures: NONE   Follow-Up: As needed At [facility], you and your health needs are our priority.  As part of our continuing mission to provide you with exceptional heart care, we have created designated Provider Care Teams.  These Care Teams include your primary Cardiologist (physician) and Advanced Practice Providers (APPs -  Physician Assistants and Nurse Practitioners) who all work together to provide you with the care you need, when you need it.  Provider:   Werner Lean, MD     Important Information About Sugar         Signed, Werner Lean, MD  07/19/2021 8:31 AM    [place]

## 2021-07-19 ENCOUNTER — Ambulatory Visit: Payer: Federal, State, Local not specified - PPO | Admitting: Internal Medicine

## 2021-07-19 ENCOUNTER — Encounter: Payer: Self-pay | Admitting: Internal Medicine

## 2021-07-19 VITALS — BP 138/80 | HR 60 | Ht 71.0 in | Wt 179.0 lb

## 2021-07-19 DIAGNOSIS — U099 Post covid-19 condition, unspecified: Secondary | ICD-10-CM

## 2021-07-19 DIAGNOSIS — R438 Other disturbances of smell and taste: Secondary | ICD-10-CM

## 2021-07-19 DIAGNOSIS — Z8616 Personal history of COVID-19: Secondary | ICD-10-CM

## 2021-07-19 DIAGNOSIS — I10 Essential (primary) hypertension: Secondary | ICD-10-CM | POA: Diagnosis not present

## 2021-07-19 NOTE — Patient Instructions (Signed)
Medication Instructions:  Your physician recommends that you continue on your current medications as directed. Please refer to the Current Medication list given to you today.  *If you need a refill on your cardiac medications before your next appointment, please call your pharmacy*   Lab Work: NONE If you have labs (blood work) drawn today and your tests are completely normal, you will receive your results only by: MyChart Message (if you have MyChart) OR A paper copy in the mail If you have any lab test that is abnormal or we need to change your treatment, we will call you to review the results.   Testing/Procedures: NONE   Follow-Up:As needed  At [facility], you and your health needs are our priority.  As part of our continuing mission to provide you with exceptional heart care, we have created designated Provider Care Teams.  These Care Teams include your primary Cardiologist (physician) and Advanced Practice Providers (APPs -  Physician Assistants and Nurse Practitioners) who all work together to provide you with the care you need, when you need it.  Provider:   Mahesh A Chandrasekhar, MD    Important Information About Sugar       

## 2021-08-24 DIAGNOSIS — S83242S Other tear of medial meniscus, current injury, left knee, sequela: Secondary | ICD-10-CM | POA: Diagnosis not present

## 2021-08-26 DIAGNOSIS — S63591A Other specified sprain of right wrist, initial encounter: Secondary | ICD-10-CM | POA: Diagnosis not present

## 2021-12-12 DIAGNOSIS — I10 Essential (primary) hypertension: Secondary | ICD-10-CM | POA: Diagnosis not present

## 2021-12-12 DIAGNOSIS — Z125 Encounter for screening for malignant neoplasm of prostate: Secondary | ICD-10-CM | POA: Diagnosis not present

## 2021-12-19 DIAGNOSIS — Z23 Encounter for immunization: Secondary | ICD-10-CM | POA: Diagnosis not present

## 2021-12-19 DIAGNOSIS — D485 Neoplasm of uncertain behavior of skin: Secondary | ICD-10-CM | POA: Diagnosis not present

## 2021-12-19 DIAGNOSIS — I10 Essential (primary) hypertension: Secondary | ICD-10-CM | POA: Diagnosis not present

## 2021-12-19 DIAGNOSIS — D1801 Hemangioma of skin and subcutaneous tissue: Secondary | ICD-10-CM | POA: Diagnosis not present

## 2021-12-19 DIAGNOSIS — U099 Post covid-19 condition, unspecified: Secondary | ICD-10-CM | POA: Diagnosis not present

## 2021-12-19 DIAGNOSIS — Z0000 Encounter for general adult medical examination without abnormal findings: Secondary | ICD-10-CM | POA: Diagnosis not present

## 2021-12-19 DIAGNOSIS — L821 Other seborrheic keratosis: Secondary | ICD-10-CM | POA: Diagnosis not present

## 2021-12-19 DIAGNOSIS — L570 Actinic keratosis: Secondary | ICD-10-CM | POA: Diagnosis not present

## 2021-12-19 DIAGNOSIS — C44519 Basal cell carcinoma of skin of other part of trunk: Secondary | ICD-10-CM | POA: Diagnosis not present

## 2021-12-19 DIAGNOSIS — L814 Other melanin hyperpigmentation: Secondary | ICD-10-CM | POA: Diagnosis not present

## 2022-01-31 DIAGNOSIS — L989 Disorder of the skin and subcutaneous tissue, unspecified: Secondary | ICD-10-CM | POA: Diagnosis not present

## 2022-01-31 DIAGNOSIS — C44519 Basal cell carcinoma of skin of other part of trunk: Secondary | ICD-10-CM | POA: Diagnosis not present

## 2022-02-27 DIAGNOSIS — F330 Major depressive disorder, recurrent, mild: Secondary | ICD-10-CM | POA: Diagnosis not present

## 2022-03-30 DIAGNOSIS — Z658 Other specified problems related to psychosocial circumstances: Secondary | ICD-10-CM | POA: Diagnosis not present

## 2022-03-30 DIAGNOSIS — F330 Major depressive disorder, recurrent, mild: Secondary | ICD-10-CM | POA: Diagnosis not present

## 2022-04-04 DIAGNOSIS — F330 Major depressive disorder, recurrent, mild: Secondary | ICD-10-CM | POA: Diagnosis not present

## 2022-04-25 DIAGNOSIS — F330 Major depressive disorder, recurrent, mild: Secondary | ICD-10-CM | POA: Diagnosis not present

## 2022-05-01 DIAGNOSIS — F330 Major depressive disorder, recurrent, mild: Secondary | ICD-10-CM | POA: Diagnosis not present

## 2022-05-01 DIAGNOSIS — Z658 Other specified problems related to psychosocial circumstances: Secondary | ICD-10-CM | POA: Diagnosis not present

## 2022-05-04 DIAGNOSIS — F331 Major depressive disorder, recurrent, moderate: Secondary | ICD-10-CM | POA: Diagnosis not present

## 2022-05-09 DIAGNOSIS — F330 Major depressive disorder, recurrent, mild: Secondary | ICD-10-CM | POA: Diagnosis not present

## 2022-05-25 DIAGNOSIS — F331 Major depressive disorder, recurrent, moderate: Secondary | ICD-10-CM | POA: Diagnosis not present

## 2022-05-30 DIAGNOSIS — F330 Major depressive disorder, recurrent, mild: Secondary | ICD-10-CM | POA: Diagnosis not present

## 2022-05-31 DIAGNOSIS — R079 Chest pain, unspecified: Secondary | ICD-10-CM | POA: Diagnosis not present

## 2022-06-13 DIAGNOSIS — F330 Major depressive disorder, recurrent, mild: Secondary | ICD-10-CM | POA: Diagnosis not present

## 2022-06-22 DIAGNOSIS — F332 Major depressive disorder, recurrent severe without psychotic features: Secondary | ICD-10-CM | POA: Diagnosis not present

## 2022-06-28 DIAGNOSIS — F330 Major depressive disorder, recurrent, mild: Secondary | ICD-10-CM | POA: Diagnosis not present

## 2022-07-18 DIAGNOSIS — F330 Major depressive disorder, recurrent, mild: Secondary | ICD-10-CM | POA: Diagnosis not present

## 2022-08-08 DIAGNOSIS — F330 Major depressive disorder, recurrent, mild: Secondary | ICD-10-CM | POA: Diagnosis not present

## 2022-08-22 IMAGING — CT CT HEART MORP W/ CTA COR W/ SCORE W/ CA W/CM &/OR W/O CM
1 series · 6 of 15 positions shown, 8 images · non-contrast
Comparison: None.
COMPARISON: None.

Addendum:
EXAM:
OVER-READ INTERPRETATION  CT CHEST

The following report is an over-read performed by radiologist Dr.
Klabdhs Orsey [REDACTED] on 02/24/2020. This
over-read does not include interpretation of cardiac or coronary
anatomy or pathology. The coronary CTA interpretation by the
cardiologist is attached.
CLINICAL DATA: 59 Year-old White Male
Cardiac/Coronary  CTA
TECHNIQUE: The patient was scanned on a [employer] Force scanner.

[Series 431: — · 6 of 15 slices shown, 8 images]
[im 3/15  vessel]
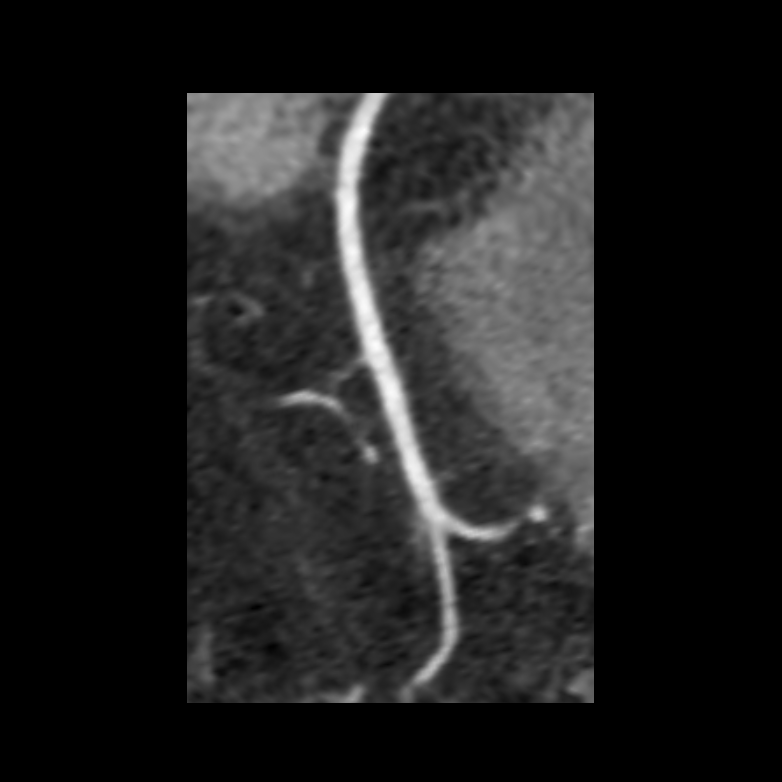
[im 3/15  lung]
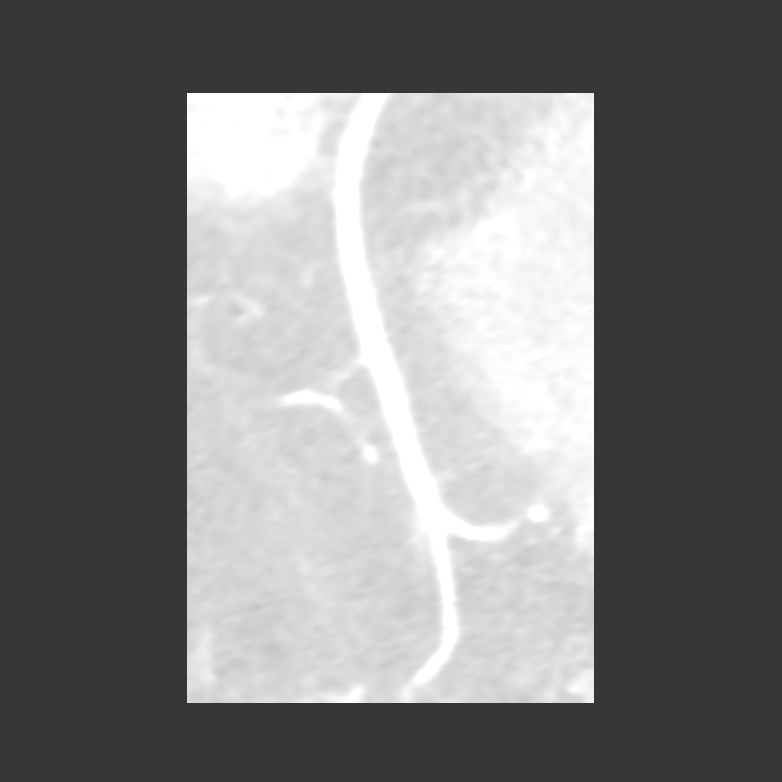
[im 5/15  vessel]
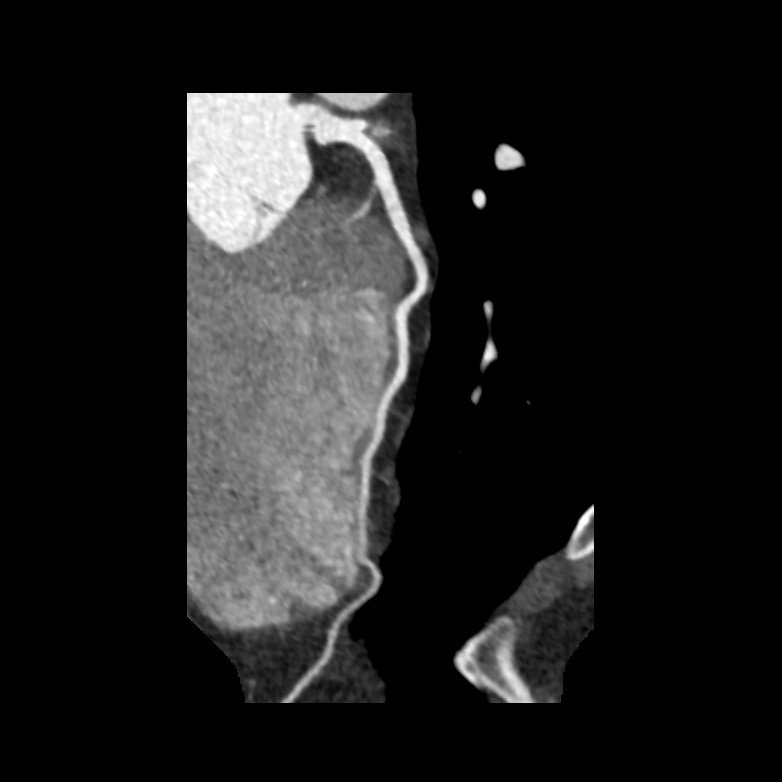
[im 7/15  vessel]
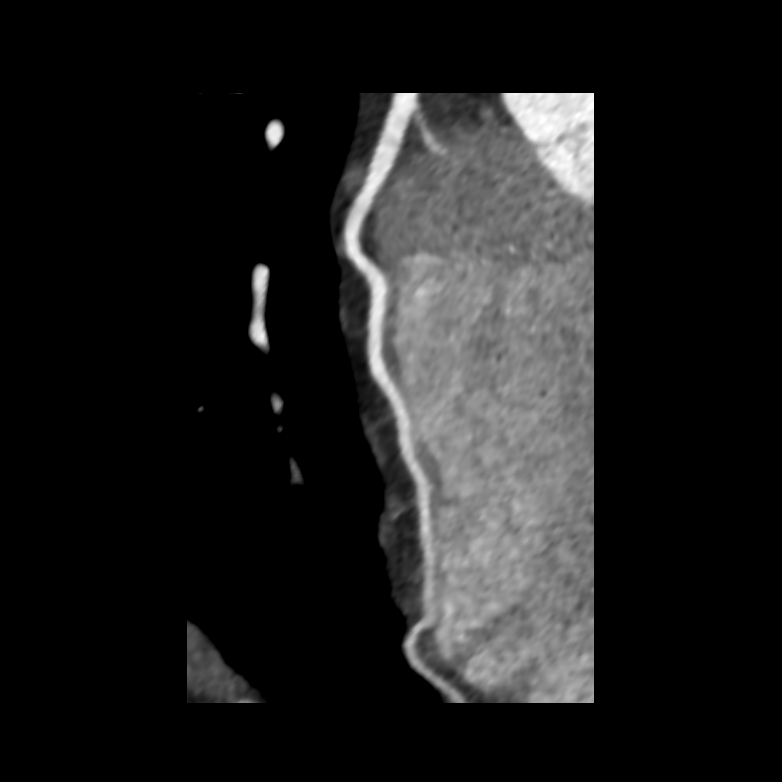
[im 9/15  vessel]
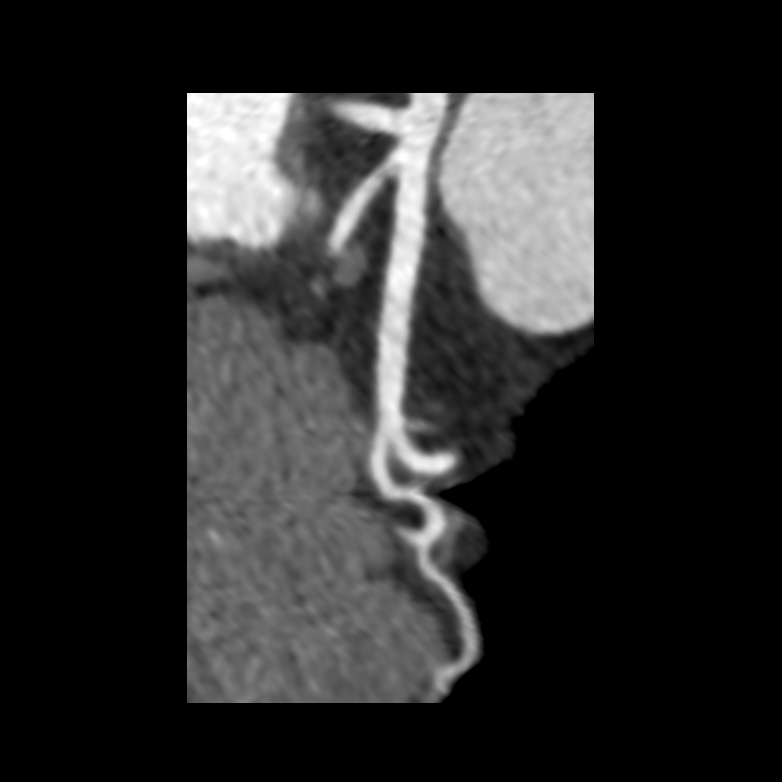
[im 11/15  vessel]
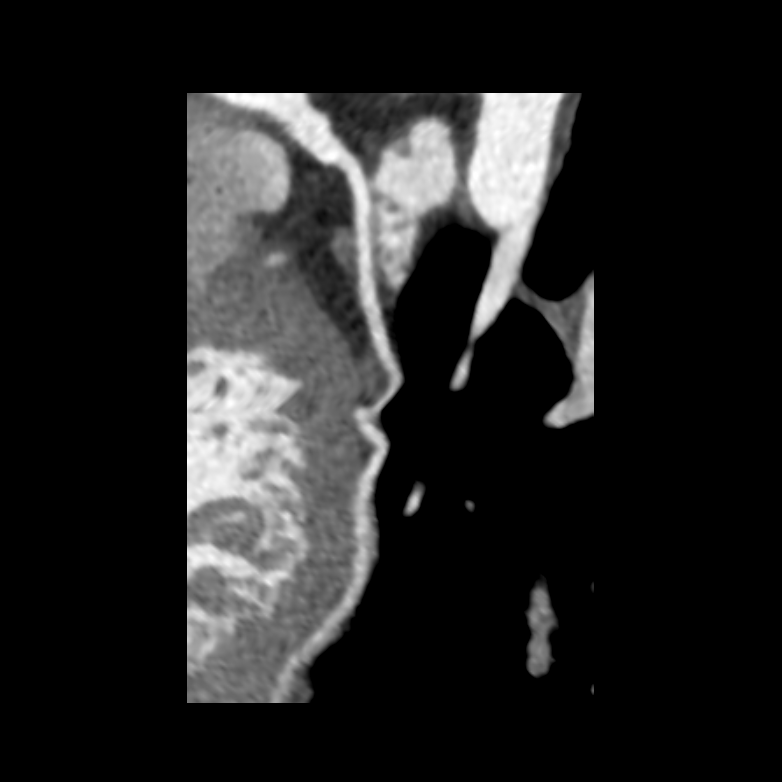
[im 11/15  lung]
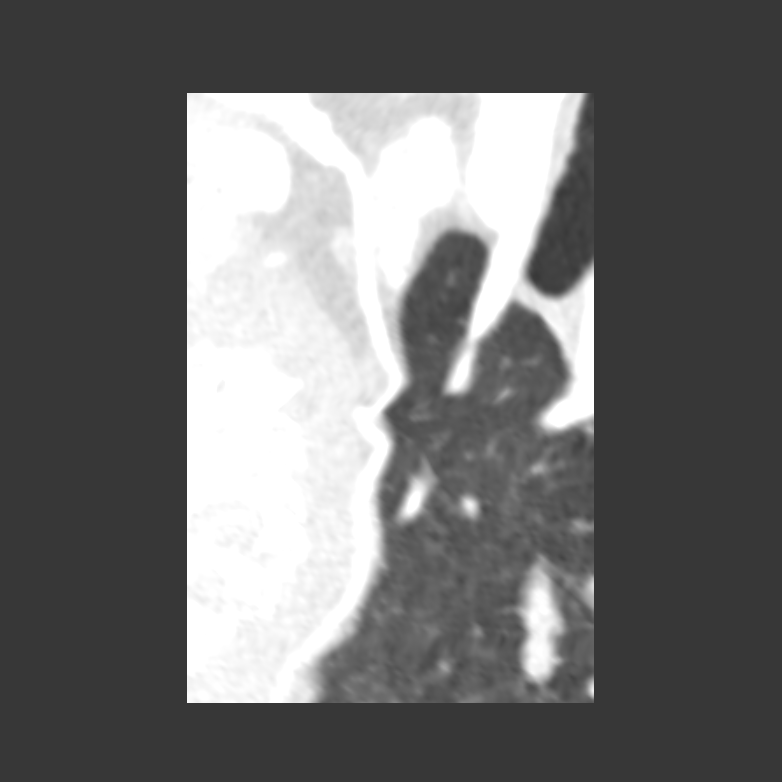
[im 13/15  vessel]
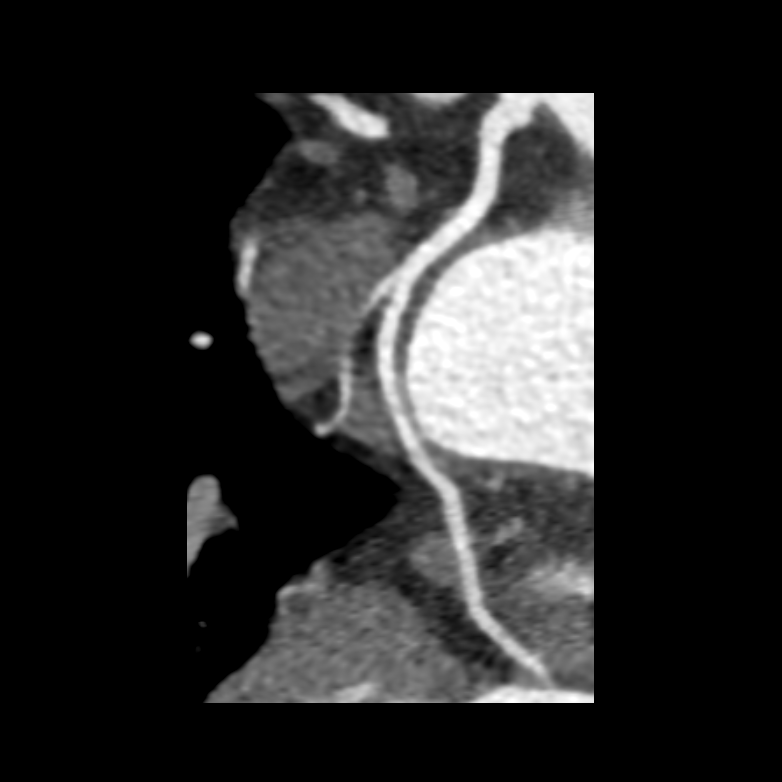

[6 of 15 positions shown; findings below may reference images not displayed]

FINDINGS: Vascular: Heart is normal size.  Aorta normal caliber.

Mediastinum/Nodes: No adenopathy

Lungs/Pleura: No confluent opacities or effusions.

Upper Abdomen: Imaging into the upper abdomen demonstrates no acute
findings.

Musculoskeletal: Chest wall soft tissues are unremarkable. No acute
bony abnormality.
IMPRESSION: No acute or significant extracardiac abnormality.
FINDINGS: A 100 kV prospective scan was triggered in the descending thoracic
aorta at 111 HU's. Axial non-contrast 3 mm slices were carried out
through the heart. The data set was analyzed on a dedicated work
station and scored using the Agatson method. Gantry rotation speed
was 250 msecs and collimation was .6 mm. No beta blockade and 0.8 mg
of sl NTG was given. The 3D data set was reconstructed in 5%
intervals of the 67-82 % of the R-R cycle. Diastolic phases were
analyzed on a dedicated work station using MPR, MIP and VRT modes.
The patient received 80 cc of contrast.

Aorta:  Normal size.  No calcifications.  No dissection.

Aortic Valve:  Tri-leaflet.  No calcifications.

Coronary Arteries:  Normal coronary origin.  Left dominance.

Coronary calcium score of 0. This was 1st percentile for age, sex,
and race matched control.

RCA is a large non-dominant artery that gives rise to a PLA. There
is no plaque.

Left main is a large artery that gives rise to LAD and LCX arteries.

LAD is a large vessel that gives rise to large D1 and D2 branches.
There is no plaque.

LCX is a dominant artery that gives rise to one large OM1 and a PDA
branch. There is no plaque.

Other findings:

Normal pulmonary vein drainage into the left atrium.

Normal left atrial appendage without a thrombus.

Normal size of the pulmonary artery.

Extra-cardiac findings: See attached radiology report for
non-cardiac structures.
IMPRESSION: 1. Coronary calcium score of 0. This was 1st percentile for age,
sex, and race matched control.

2. Normal coronary origin.  Right dominance.

3. CAD-RADS 0. No evidence of CAD (0%). Consider non-atherosclerotic
causes of chest pain.

*** End of Addendum ***
EXAM:
OVER-READ INTERPRETATION  CT CHEST

The following report is an over-read performed by radiologist Dr.
Klabdhs Orsey [REDACTED] on 02/24/2020. This
over-read does not include interpretation of cardiac or coronary
anatomy or pathology. The coronary CTA interpretation by the
cardiologist is attached.
FINDINGS: Vascular: Heart is normal size.  Aorta normal caliber.

Mediastinum/Nodes: No adenopathy

Lungs/Pleura: No confluent opacities or effusions.

Upper Abdomen: Imaging into the upper abdomen demonstrates no acute
findings.

Musculoskeletal: Chest wall soft tissues are unremarkable. No acute
bony abnormality.
IMPRESSION: No acute or significant extracardiac abnormality.

## 2022-08-28 DIAGNOSIS — F330 Major depressive disorder, recurrent, mild: Secondary | ICD-10-CM | POA: Diagnosis not present

## 2022-09-19 DIAGNOSIS — F330 Major depressive disorder, recurrent, mild: Secondary | ICD-10-CM | POA: Diagnosis not present

## 2022-09-21 DIAGNOSIS — F332 Major depressive disorder, recurrent severe without psychotic features: Secondary | ICD-10-CM | POA: Diagnosis not present

## 2022-10-09 DIAGNOSIS — H00015 Hordeolum externum left lower eyelid: Secondary | ICD-10-CM | POA: Diagnosis not present

## 2022-10-17 DIAGNOSIS — F330 Major depressive disorder, recurrent, mild: Secondary | ICD-10-CM | POA: Diagnosis not present

## 2022-11-07 DIAGNOSIS — F330 Major depressive disorder, recurrent, mild: Secondary | ICD-10-CM | POA: Diagnosis not present

## 2022-11-27 DIAGNOSIS — F330 Major depressive disorder, recurrent, mild: Secondary | ICD-10-CM | POA: Diagnosis not present

## 2022-12-19 DIAGNOSIS — F330 Major depressive disorder, recurrent, mild: Secondary | ICD-10-CM | POA: Diagnosis not present

## 2022-12-25 DIAGNOSIS — F332 Major depressive disorder, recurrent severe without psychotic features: Secondary | ICD-10-CM | POA: Diagnosis not present

## 2023-01-21 DIAGNOSIS — L821 Other seborrheic keratosis: Secondary | ICD-10-CM | POA: Diagnosis not present

## 2023-01-21 DIAGNOSIS — Z08 Encounter for follow-up examination after completed treatment for malignant neoplasm: Secondary | ICD-10-CM | POA: Diagnosis not present

## 2023-01-21 DIAGNOSIS — L814 Other melanin hyperpigmentation: Secondary | ICD-10-CM | POA: Diagnosis not present

## 2023-01-21 DIAGNOSIS — D225 Melanocytic nevi of trunk: Secondary | ICD-10-CM | POA: Diagnosis not present

## 2023-01-22 DIAGNOSIS — F332 Major depressive disorder, recurrent severe without psychotic features: Secondary | ICD-10-CM | POA: Diagnosis not present

## 2023-01-30 DIAGNOSIS — Z125 Encounter for screening for malignant neoplasm of prostate: Secondary | ICD-10-CM | POA: Diagnosis not present

## 2023-01-30 DIAGNOSIS — I10 Essential (primary) hypertension: Secondary | ICD-10-CM | POA: Diagnosis not present

## 2023-02-05 DIAGNOSIS — F330 Major depressive disorder, recurrent, mild: Secondary | ICD-10-CM | POA: Diagnosis not present

## 2023-02-06 DIAGNOSIS — Z0000 Encounter for general adult medical examination without abnormal findings: Secondary | ICD-10-CM | POA: Diagnosis not present

## 2023-02-06 DIAGNOSIS — I10 Essential (primary) hypertension: Secondary | ICD-10-CM | POA: Diagnosis not present

## 2023-02-18 DIAGNOSIS — F332 Major depressive disorder, recurrent severe without psychotic features: Secondary | ICD-10-CM | POA: Diagnosis not present

## 2023-03-05 DIAGNOSIS — F330 Major depressive disorder, recurrent, mild: Secondary | ICD-10-CM | POA: Diagnosis not present

## 2023-03-26 DIAGNOSIS — F330 Major depressive disorder, recurrent, mild: Secondary | ICD-10-CM | POA: Diagnosis not present

## 2023-04-23 DIAGNOSIS — F330 Major depressive disorder, recurrent, mild: Secondary | ICD-10-CM | POA: Diagnosis not present

## 2023-04-24 DIAGNOSIS — L304 Erythema intertrigo: Secondary | ICD-10-CM | POA: Diagnosis not present

## 2023-05-13 DIAGNOSIS — F331 Major depressive disorder, recurrent, moderate: Secondary | ICD-10-CM | POA: Diagnosis not present

## 2023-05-14 DIAGNOSIS — F330 Major depressive disorder, recurrent, mild: Secondary | ICD-10-CM | POA: Diagnosis not present

## 2023-07-10 DIAGNOSIS — F330 Major depressive disorder, recurrent, mild: Secondary | ICD-10-CM | POA: Diagnosis not present

## 2023-08-06 DIAGNOSIS — D1801 Hemangioma of skin and subcutaneous tissue: Secondary | ICD-10-CM | POA: Diagnosis not present

## 2023-08-06 DIAGNOSIS — Z08 Encounter for follow-up examination after completed treatment for malignant neoplasm: Secondary | ICD-10-CM | POA: Diagnosis not present

## 2023-08-06 DIAGNOSIS — L820 Inflamed seborrheic keratosis: Secondary | ICD-10-CM | POA: Diagnosis not present

## 2023-08-06 DIAGNOSIS — L821 Other seborrheic keratosis: Secondary | ICD-10-CM | POA: Diagnosis not present

## 2023-08-06 DIAGNOSIS — L814 Other melanin hyperpigmentation: Secondary | ICD-10-CM | POA: Diagnosis not present

## 2023-08-06 DIAGNOSIS — R208 Other disturbances of skin sensation: Secondary | ICD-10-CM | POA: Diagnosis not present

## 2023-08-06 DIAGNOSIS — Z789 Other specified health status: Secondary | ICD-10-CM | POA: Diagnosis not present

## 2023-08-15 DIAGNOSIS — F330 Major depressive disorder, recurrent, mild: Secondary | ICD-10-CM | POA: Diagnosis not present

## 2023-08-21 DIAGNOSIS — F331 Major depressive disorder, recurrent, moderate: Secondary | ICD-10-CM | POA: Diagnosis not present

## 2023-09-18 DIAGNOSIS — F330 Major depressive disorder, recurrent, mild: Secondary | ICD-10-CM | POA: Diagnosis not present

## 2023-11-14 DIAGNOSIS — F332 Major depressive disorder, recurrent severe without psychotic features: Secondary | ICD-10-CM | POA: Diagnosis not present

## 2023-11-20 DIAGNOSIS — F330 Major depressive disorder, recurrent, mild: Secondary | ICD-10-CM | POA: Diagnosis not present

## 2023-12-18 DIAGNOSIS — F330 Major depressive disorder, recurrent, mild: Secondary | ICD-10-CM | POA: Diagnosis not present

## 2024-01-28 DIAGNOSIS — F330 Major depressive disorder, recurrent, mild: Secondary | ICD-10-CM | POA: Diagnosis not present

## 2024-02-06 DIAGNOSIS — L2089 Other atopic dermatitis: Secondary | ICD-10-CM | POA: Diagnosis not present

## 2024-02-06 DIAGNOSIS — D1801 Hemangioma of skin and subcutaneous tissue: Secondary | ICD-10-CM | POA: Diagnosis not present

## 2024-02-06 DIAGNOSIS — L814 Other melanin hyperpigmentation: Secondary | ICD-10-CM | POA: Diagnosis not present

## 2024-02-06 DIAGNOSIS — L821 Other seborrheic keratosis: Secondary | ICD-10-CM | POA: Diagnosis not present

## 2024-02-12 DIAGNOSIS — Z1331 Encounter for screening for depression: Secondary | ICD-10-CM | POA: Diagnosis not present

## 2024-02-12 DIAGNOSIS — Z0000 Encounter for general adult medical examination without abnormal findings: Secondary | ICD-10-CM | POA: Diagnosis not present

## 2024-02-12 DIAGNOSIS — I10 Essential (primary) hypertension: Secondary | ICD-10-CM | POA: Diagnosis not present

## 2024-02-12 DIAGNOSIS — Z1339 Encounter for screening examination for other mental health and behavioral disorders: Secondary | ICD-10-CM | POA: Diagnosis not present

## 2024-02-18 DIAGNOSIS — F330 Major depressive disorder, recurrent, mild: Secondary | ICD-10-CM | POA: Diagnosis not present
